# Patient Record
Sex: Female | Born: 1950 | Race: White | Hispanic: No | Smoking: Never smoker
Health system: Southern US, Community
[De-identification: ages and names within clinical notes are randomized; demographics above are authoritative.]

## PROBLEM LIST (undated history)

## (undated) DIAGNOSIS — K5792 Diverticulitis of intestine, part unspecified, without perforation or abscess without bleeding: Secondary | ICD-10-CM

## (undated) DIAGNOSIS — C439 Malignant melanoma of skin, unspecified: Secondary | ICD-10-CM

## (undated) HISTORY — PX: BREAST CYST ASPIRATION: SHX578

## (undated) HISTORY — DX: Malignant melanoma of skin, unspecified: C43.9

## (undated) HISTORY — PX: TONSILLECTOMY AND ADENOIDECTOMY: SUR1326

---

## 1978-10-22 HISTORY — PX: OVARIAN CYST REMOVAL: SHX89

## 1998-08-11 ENCOUNTER — Emergency Department (HOSPITAL_COMMUNITY): Admission: EM | Admit: 1998-08-11 | Discharge: 1998-08-11 | Payer: Self-pay

## 1998-10-24 ENCOUNTER — Other Ambulatory Visit: Admission: RE | Admit: 1998-10-24 | Discharge: 1998-10-24 | Payer: Self-pay | Admitting: Gynecology

## 1998-11-11 ENCOUNTER — Encounter: Payer: Self-pay | Admitting: Gynecology

## 1998-11-11 ENCOUNTER — Ambulatory Visit (HOSPITAL_COMMUNITY): Admission: RE | Admit: 1998-11-11 | Discharge: 1998-11-11 | Payer: Self-pay | Admitting: Gynecology

## 1998-11-18 ENCOUNTER — Ambulatory Visit (HOSPITAL_COMMUNITY): Admission: RE | Admit: 1998-11-18 | Discharge: 1998-11-18 | Payer: Self-pay | Admitting: Gynecology

## 1998-11-18 ENCOUNTER — Encounter: Payer: Self-pay | Admitting: Gynecology

## 2000-03-28 ENCOUNTER — Other Ambulatory Visit: Admission: RE | Admit: 2000-03-28 | Discharge: 2000-03-28 | Payer: Self-pay | Admitting: Obstetrics and Gynecology

## 2001-05-02 ENCOUNTER — Other Ambulatory Visit: Admission: RE | Admit: 2001-05-02 | Discharge: 2001-05-02 | Payer: Self-pay | Admitting: Obstetrics and Gynecology

## 2001-11-17 ENCOUNTER — Encounter: Payer: Self-pay | Admitting: Obstetrics and Gynecology

## 2001-11-17 ENCOUNTER — Ambulatory Visit (HOSPITAL_COMMUNITY): Admission: RE | Admit: 2001-11-17 | Discharge: 2001-11-17 | Payer: Self-pay | Admitting: Gynecology

## 2001-11-24 ENCOUNTER — Encounter: Payer: Self-pay | Admitting: Emergency Medicine

## 2001-11-24 ENCOUNTER — Emergency Department (HOSPITAL_COMMUNITY): Admission: EM | Admit: 2001-11-24 | Discharge: 2001-11-24 | Payer: Self-pay | Admitting: Emergency Medicine

## 2001-12-11 ENCOUNTER — Encounter: Payer: Self-pay | Admitting: Internal Medicine

## 2001-12-11 ENCOUNTER — Ambulatory Visit (HOSPITAL_COMMUNITY): Admission: RE | Admit: 2001-12-11 | Discharge: 2001-12-11 | Payer: Self-pay | Admitting: Internal Medicine

## 2002-07-31 ENCOUNTER — Encounter: Admission: RE | Admit: 2002-07-31 | Discharge: 2002-07-31 | Payer: Self-pay | Admitting: Internal Medicine

## 2002-07-31 ENCOUNTER — Encounter: Payer: Self-pay | Admitting: Internal Medicine

## 2002-11-11 ENCOUNTER — Ambulatory Visit (HOSPITAL_COMMUNITY): Admission: RE | Admit: 2002-11-11 | Discharge: 2002-11-11 | Payer: Self-pay | Admitting: Gastroenterology

## 2003-10-21 ENCOUNTER — Ambulatory Visit (HOSPITAL_COMMUNITY): Admission: RE | Admit: 2003-10-21 | Discharge: 2003-10-21 | Payer: Self-pay | Admitting: Internal Medicine

## 2004-10-19 ENCOUNTER — Ambulatory Visit (HOSPITAL_COMMUNITY): Admission: RE | Admit: 2004-10-19 | Discharge: 2004-10-19 | Payer: Self-pay | Admitting: Internal Medicine

## 2005-03-08 ENCOUNTER — Encounter: Admission: RE | Admit: 2005-03-08 | Discharge: 2005-03-08 | Payer: Self-pay | Admitting: Internal Medicine

## 2005-10-26 ENCOUNTER — Encounter: Admission: RE | Admit: 2005-10-26 | Discharge: 2005-10-26 | Payer: Self-pay | Admitting: Internal Medicine

## 2005-11-13 ENCOUNTER — Encounter: Admission: RE | Admit: 2005-11-13 | Discharge: 2005-11-13 | Payer: Self-pay | Admitting: Internal Medicine

## 2006-06-11 ENCOUNTER — Encounter: Admission: RE | Admit: 2006-06-11 | Discharge: 2006-06-11 | Payer: Self-pay | Admitting: Internal Medicine

## 2007-01-14 ENCOUNTER — Other Ambulatory Visit: Admission: RE | Admit: 2007-01-14 | Discharge: 2007-01-14 | Payer: Self-pay | Admitting: Gynecology

## 2007-01-14 ENCOUNTER — Encounter: Admission: RE | Admit: 2007-01-14 | Discharge: 2007-01-14 | Payer: Self-pay | Admitting: Internal Medicine

## 2007-02-24 ENCOUNTER — Encounter: Admission: RE | Admit: 2007-02-24 | Discharge: 2007-02-24 | Payer: Self-pay | Admitting: Internal Medicine

## 2008-02-04 ENCOUNTER — Ambulatory Visit (HOSPITAL_COMMUNITY): Admission: RE | Admit: 2008-02-04 | Discharge: 2008-02-04 | Payer: Self-pay | Admitting: Gynecology

## 2008-02-16 ENCOUNTER — Encounter: Admission: RE | Admit: 2008-02-16 | Discharge: 2008-02-16 | Payer: Self-pay | Admitting: Gynecology

## 2008-02-24 ENCOUNTER — Encounter: Admission: RE | Admit: 2008-02-24 | Discharge: 2008-02-24 | Payer: Self-pay | Admitting: Gynecology

## 2008-02-24 ENCOUNTER — Other Ambulatory Visit: Admission: RE | Admit: 2008-02-24 | Discharge: 2008-02-24 | Payer: Self-pay | Admitting: Diagnostic Radiology

## 2008-02-25 ENCOUNTER — Encounter (INDEPENDENT_AMBULATORY_CARE_PROVIDER_SITE_OTHER): Payer: Self-pay | Admitting: Diagnostic Radiology

## 2009-01-20 ENCOUNTER — Encounter (INDEPENDENT_AMBULATORY_CARE_PROVIDER_SITE_OTHER): Payer: Self-pay | Admitting: *Deleted

## 2009-03-02 ENCOUNTER — Ambulatory Visit: Payer: Self-pay | Admitting: Family Medicine

## 2009-03-02 DIAGNOSIS — R5383 Other fatigue: Secondary | ICD-10-CM

## 2009-03-02 DIAGNOSIS — Z8719 Personal history of other diseases of the digestive system: Secondary | ICD-10-CM

## 2009-03-02 DIAGNOSIS — R5381 Other malaise: Secondary | ICD-10-CM

## 2009-03-02 DIAGNOSIS — R1032 Left lower quadrant pain: Secondary | ICD-10-CM

## 2009-03-04 ENCOUNTER — Encounter (INDEPENDENT_AMBULATORY_CARE_PROVIDER_SITE_OTHER): Payer: Self-pay | Admitting: *Deleted

## 2009-03-04 LAB — CONVERTED CEMR LAB
Basophils Absolute: 0.1 10*3/uL (ref 0.0–0.1)
Basophils Relative: 0.8 % (ref 0.0–3.0)
Eosinophils Absolute: 0.2 10*3/uL (ref 0.0–0.7)
Eosinophils Relative: 2.2 % (ref 0.0–5.0)
HCT: 38.2 % (ref 36.0–46.0)
Hemoglobin: 13.3 g/dL (ref 12.0–15.0)
Lymphocytes Relative: 41.9 % (ref 12.0–46.0)
Lymphs Abs: 2.9 10*3/uL (ref 0.7–4.0)
MCHC: 34.7 g/dL (ref 30.0–36.0)
MCV: 91.5 fL (ref 78.0–100.0)
Monocytes Absolute: 0.7 10*3/uL (ref 0.1–1.0)
Monocytes Relative: 9.3 % (ref 3.0–12.0)
Neutro Abs: 3.1 10*3/uL (ref 1.4–7.7)
Neutrophils Relative %: 45.8 % (ref 43.0–77.0)
Platelets: 398 10*3/uL (ref 150.0–400.0)
RBC: 4.18 M/uL (ref 3.87–5.11)
RDW: 12.1 % (ref 11.5–14.6)
TSH: 1.07 microintl units/mL (ref 0.35–5.50)
WBC: 7 10*3/uL (ref 4.5–10.5)

## 2009-04-29 ENCOUNTER — Ambulatory Visit (HOSPITAL_COMMUNITY): Admission: RE | Admit: 2009-04-29 | Discharge: 2009-04-29 | Payer: Self-pay | Admitting: Family Medicine

## 2010-01-02 ENCOUNTER — Emergency Department (HOSPITAL_COMMUNITY): Admission: EM | Admit: 2010-01-02 | Discharge: 2010-01-02 | Payer: Self-pay | Admitting: Emergency Medicine

## 2010-06-06 ENCOUNTER — Ambulatory Visit (HOSPITAL_COMMUNITY): Admission: RE | Admit: 2010-06-06 | Discharge: 2010-06-06 | Payer: Self-pay | Admitting: Family Medicine

## 2010-07-25 ENCOUNTER — Ambulatory Visit: Payer: Self-pay | Admitting: Family Medicine

## 2010-11-23 NOTE — Assessment & Plan Note (Signed)
Summary: dizzy,ear pressure,congested/cbsi   Vital Signs:  Patient profile:   60 year old female Weight:      167.4 pounds BMI:     28.39 Temp:     98.4 degrees F oral BP sitting:   118 / 76 Cuff size:   regular  Vitals Entered By: Almeta Monas CMA Duncan Dull) (July 25, 2010 11:09 AM) CC: C/O DIZZINESS, NAUSEA, HEAD COLD AND FEELING TIRED, URI symptoms   History of Present Illness:       This is a 60 year old woman who presents with URI symptoms.  The symptoms began 1 week ago.  Pt c/o pain with inspiration last week.  Pt states it started with UR congestion. Pt only took otc ib and asa.  The patient complains of nasal congestion, purulent nasal discharge, dry cough, earache, and sick contacts.  The patient denies fever, low-grade fever (<100.5 degrees), fever of 100.5-103 degrees, fever of 103.1-104 degrees, fever to >104 degrees, stiff neck, dyspnea, wheezing, rash, vomiting, diarrhea, use of an antipyretic, and response to antipyretic.  The patient also reports headache and severe fatigue.  The patient denies itchy watery eyes, itchy throat, sneezing, seasonal symptoms, response to antihistamine, and muscle aches.  The patient denies the following risk factors for Strep sinusitis: unilateral facial pain, unilateral nasal discharge, poor response to decongestant, double sickening, tooth pain, Strep exposure, tender adenopathy, and absence of cough.    Current Medications (verified): 1)  Zithromax Z-Pak 250 Mg Tabs (Azithromycin) .... As Directed 2)  Claritin 10 Mg Tabs (Loratadine) .Marland Kitchen.. 1 By Mouth Once Daily 3)  Mucinex Dm 30-600 Mg Xr12h-Tab (Dextromethorphan-Guaifenesin)  Allergies (verified): No Known Drug Allergies  Past History:  Past medical, surgical, family and social histories (including risk factors) reviewed for relevance to current acute and chronic problems.  Past Medical History: Reviewed history from 03/02/2009 and no changes required. Diverticulitis, hx of  Past  Surgical History: Reviewed history from 03/02/2009 and no changes required. right ovarian cyst  wisdom teeth   Family History: Reviewed history from 03/02/2009 and no changes required. CAD-maternal brother HTN-no DM-no STROKE-no COLON CA-no BREAST CA- 2 sisters' dx in 39's  Social History: Reviewed history from 03/02/2009 and no changes required. 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 Nasal mucosa are pink and moist without lesions or exudates. Mouth:  Oral mucosa and oropharynx without lesions or exudates.  Teeth in good repair. Neck:  No deformities, masses, or tenderness noted. Lungs:  normal respiratory effort, no intercostal retractions, R wheezes, and L wheezes.   Heart:  Normal rate and regular rhythm. S1 and S2 normal without gallop, murmur, click, rub or other extra sounds. Psych:  Cognition and judgment appear intact. Alert and cooperative with normal attention span and concentration. No apparent delusions, illusions, hallucinations   Impression & Recommendations:  Problem # 1:  BRONCHITIS- ACUTE (ICD-466.0)  The following medications were removed from the medication list:    Metronidazole 500 Mg Tabs (Metronidazole) .Marland Kitchen... 1 two times a day x10 days Her updated medication list for this problem includes:    Zithromax Z-pak 250 Mg Tabs (Azithromycin) .Marland Kitchen... As directed    Mucinex Dm 30-600 Mg Xr12h-tab (Dextromethorphan-guaifenesin)  Take antibiotics and other medications as directed. Encouraged to push clear liquids,  get enough rest, and take acetaminophen as needed. To be seen in 5-7 days if no improvement, sooner if worse.  Complete Medication List: 1)  Zithromax Z-pak 250 Mg Tabs  (Azithromycin) .... As directed 2)  Claritin 10 Mg Tabs (Loratadine) .Marland Kitchen.. 1 by mouth once daily 3)  Mucinex Dm 30-600 Mg Xr12h-tab (Dextromethorphan-guaifenesin)  Patient Instructions: 1)  Acute Bronchitis symptoms for less then 10 days are not  helped by antibiotics. Take over the counter cough medications. Call if no improvement in 5-7 days, sooner if increasing cough, fever, or new symptoms ( shortness of breath, chest pain) .  Prescriptions: CLARITIN 10 MG TABS (LORATADINE) 1 by mouth once daily  #30 x 2   Entered and Authorized by:   Loreen Freud DO   Signed by:   Loreen Freud DO on 07/25/2010   Method used:   Faxed to ...       [facility] [address] (retail)       [address]       [place], [place]  [address]       Ph: [phone]       Fax: [phone]   RxID:   [id number] ZITHROMAX Z-PAK 250 MG TABS (AZITHROMYCIN) as directed  #1 x 0   Entered and Authorized by:   Loreen Freud DO   Signed by:   Loreen Freud DO on 07/25/2010   Method used:   Faxed to ...       [facility] [address] (retail)       [address]       [place], [place]  [address]       Ph: [phone]       Fax: [phone]   RxID:   [id number]

## 2011-03-09 NOTE — Op Note (Signed)
   Joy Mosley, Joy Mosley                           ACCOUNT NO.:  [id number]   MEDICAL RECORD NO.:  [id number]                   PATIENT TYPE:   LOCATION:                                       FACILITY:   PHYSICIAN:  Anselmo Rod, M.D.               DATE OF BIRTH:  07-07-1951   DATE OF PROCEDURE:  11/11/2002  DATE OF DISCHARGE:                                 OPERATIVE REPORT   PROCEDURE PERFORMED:  Colonoscopy.   ENDOSCOPIST:  Charna Elizabeth, M.D.   INSTRUMENT USED:  Pediatric adjustable Olympus colonoscope.   INDICATIONS FOR PROCEDURE:  The patient is a 60 year old white female  undergoing screening colonoscopy.  The patient had a history of occasional  rectal bleeding.  Rule out colonic polyps, masses, etc.   PREPROCEDURE PREPARATION:  Informed consent was procured from the patient.  The patient was fasted for eight hours prior to the procedure and prepped  with a bottle of Gatorade and MiraLax the night prior to the procedure.   PREPROCEDURE PHYSICAL:  The patient had stable vital signs.  Neck supple.  Chest clear to auscultation.  S1 and S2 regular.  Abdomen soft with normal  bowel sounds.   DESCRIPTION OF PROCEDURE:  The patient was placed in left lateral decubitus  position and sedated with 70 mg of Demerol and 6 mg of Versed intravenously.  Once the patient was adequately sedated and maintained on low flow oxygen  and continuous cardiac monitoring, the Olympus video colonoscope was  advanced from the rectum to the cecum without difficulty.  Small nonbleeding  hemorrhoids were seen on retroflexion.  No masses, polyps, erosions,  ulcerations or diverticula were present.   IMPRESSION:  Normal colonoscopy up to the cecum except for small internal  hemorrhoids and a few left sided diverticuli. Marland Kitchen    RECOMMENDATIONS:  High fiber diet with liveral fluid intake.  Repeat  colorectal cancer screening in five years unless she develops any abnormal  symptoms in the interim.  OP  follow-up on a prn basis.   RECOMMENDATIONS:  1. A high fiber diet has been recommended for the patient with liberal fluid     intake.  2. Repeat colorectal cancer screening is advised in the next five years     unless the patient develops any abnormal symptoms in the interim.                                                   Anselmo Rod, M.D.    JNM/MEDQ  D:  11/11/2002  T:  11/11/2002  Job:  [id number]   cc:   Olene Craven, M.D.  [address]  [address]  [place]  [place] [address]  Fax: [phone]

## 2011-03-09 NOTE — Op Note (Signed)
   NAME:  Joy Mosley, Joy Mosley                         ACCOUNT NO.:  [id number]   MEDICAL RECORD NO.:  [id number]                   PATIENT TYPE:  AMB   LOCATION:  ENDO                                 FACILITY:  [facility]   PHYSICIAN:  Anselmo Rod, M.D.               DATE OF BIRTH:  11-Nov-1950   DATE OF PROCEDURE:  11/11/2002  DATE OF DISCHARGE:                                 OPERATIVE REPORT   ADDENDUM:  The patient had a couple of left-sided diverticula with no other  abnormalities noted on colonoscopy except for small internal hemorrhoids.                                               Anselmo Rod, M.D.    JNM/MEDQ  D:  11/11/2002  T:  11/11/2002  Job:  [id number]   cc:   Olene Craven, M.D.  [address]  [address]  [place]  [place] [address]  Fax: [phone]

## 2011-09-06 ENCOUNTER — Other Ambulatory Visit: Payer: Self-pay | Admitting: Family Medicine

## 2011-09-06 DIAGNOSIS — Z1231 Encounter for screening mammogram for malignant neoplasm of breast: Secondary | ICD-10-CM

## 2011-10-09 ENCOUNTER — Ambulatory Visit (HOSPITAL_COMMUNITY)
Admission: RE | Admit: 2011-10-09 | Discharge: 2011-10-09 | Disposition: A | Discharge status: Home / Self Care | Payer: 59 | Source: Ambulatory Visit | Attending: Family Medicine | Admitting: Family Medicine

## 2011-10-09 DIAGNOSIS — Z1231 Encounter for screening mammogram for malignant neoplasm of breast: Secondary | ICD-10-CM | POA: Insufficient documentation

## 2012-06-09 ENCOUNTER — Encounter: Payer: Self-pay | Admitting: *Deleted

## 2012-06-09 ENCOUNTER — Ambulatory Visit (INDEPENDENT_AMBULATORY_CARE_PROVIDER_SITE_OTHER): Payer: 59 | Admitting: Family Medicine

## 2012-06-09 VITALS — BP 110/68 | HR 60 | Temp 97.6°F | Wt 169.0 lb

## 2012-06-09 DIAGNOSIS — R5383 Other fatigue: Secondary | ICD-10-CM

## 2012-06-09 DIAGNOSIS — R5381 Other malaise: Secondary | ICD-10-CM

## 2012-06-09 LAB — CBC WITH DIFFERENTIAL/PLATELET
Basophils Absolute: 0.1 10*3/uL (ref 0.0–0.1)
Basophils Absolute: 0.1 10*3/uL (ref 0.0–0.1)
Basophils Relative: 0.8 % (ref 0.0–3.0)
Basophils Relative: 0.6 % (ref 0.0–3.0)
Eosinophils Absolute: 0.1 10*3/uL (ref 0.0–0.7)
Eosinophils Absolute: 0.1 10*3/uL (ref 0.0–0.7)
Eosinophils Relative: 2.1 % (ref 0.0–5.0)
Eosinophils Relative: 1.4 % (ref 0.0–5.0)
HCT: 41.1 % (ref 36.0–46.0)
HCT: 40.8 % (ref 36.0–46.0)
Hemoglobin: 13.7 g/dL (ref 12.0–15.0)
Hemoglobin: 13.5 g/dL (ref 12.0–15.0)
Lymphocytes Relative: 42.9 % (ref 12.0–46.0)
Lymphocytes Relative: 41.2 % (ref 12.0–46.0)
Lymphs Abs: 3 10*3/uL (ref 0.7–4.0)
Lymphs Abs: 3.4 10*3/uL (ref 0.7–4.0)
MCHC: 33.4 g/dL (ref 30.0–36.0)
MCHC: 33.1 g/dL (ref 30.0–36.0)
MCV: 91.7 fl (ref 78.0–100.0)
MCV: 91.3 fl (ref 78.0–100.0)
Monocytes Absolute: 0.6 10*3/uL (ref 0.1–1.0)
Monocytes Absolute: 0.7 10*3/uL (ref 0.1–1.0)
Monocytes Relative: 8.4 % (ref 3.0–12.0)
Monocytes Relative: 9 % (ref 3.0–12.0)
Neutro Abs: 3.2 10*3/uL (ref 1.4–7.7)
Neutro Abs: 4 10*3/uL (ref 1.4–7.7)
Neutrophils Relative %: 45.8 % (ref 43.0–77.0)
Neutrophils Relative %: 47.8 % (ref 43.0–77.0)
Platelets: 299 10*3/uL (ref 150.0–400.0)
Platelets: 296 10*3/uL (ref 150.0–400.0)
RBC: 4.48 Mil/uL (ref 3.87–5.11)
RBC: 4.47 Mil/uL (ref 3.87–5.11)
RDW: 13.4 % (ref 11.5–14.6)
RDW: 13.4 % (ref 11.5–14.6)
WBC: 7 10*3/uL (ref 4.5–10.5)
WBC: 8.3 10*3/uL (ref 4.5–10.5)

## 2012-06-09 LAB — BASIC METABOLIC PANEL
BUN: 15 mg/dL (ref 6–23)
CO2: 30 mEq/L (ref 19–32)
Calcium: 8.9 mg/dL (ref 8.4–10.5)
Chloride: 106 mEq/L (ref 96–112)
Creatinine, Ser: 0.7 mg/dL (ref 0.4–1.2)
GFR: 96.21 mL/min (ref >60.00)
Glucose, Bld: 69 mg/dL — ABNORMAL LOW (ref 70–99)
Potassium: 4 mEq/L (ref 3.5–5.1)
Sodium: 141 mEq/L (ref 135–145)

## 2012-06-09 LAB — TSH: TSH: 1.08 u[IU]/mL (ref 0.35–5.50)

## 2012-06-09 NOTE — Progress Notes (Signed)
  Subjective:    Patient ID: Joy Mosley, female    DOB: 16-Oct-1951, 61 y.o.   MRN: [id number]  HPI Fatigue- reports feeling 'really tired and run down'.  2 weeks ago felt 'flu-ish', 'achy and tired'.  Denies fever, chills.  Had some nausea, HA but this has all improved.  No known sick contacts.  Sleeping well.  Denies increased or excessive stress.  Biggest complaint w/ 'so tired'.   Review of Systems For ROS see HPI     Objective:   Physical Exam  Constitutional: She is oriented to person, place, and time. She appears well-developed and well-nourished. No distress.  HENT:  Head: Normocephalic and atraumatic.       TMs normal bilaterally Mild nasal congestion Throat w/out erythema, edema, or exudate  Eyes: Conjunctivae and EOM are normal. Pupils are equal, round, and reactive to light.  Neck: Normal range of motion. Neck supple. No thyromegaly present.  Cardiovascular: Normal rate, regular rhythm, normal heart sounds and intact distal pulses.   No murmur heard. Pulmonary/Chest: Effort normal and breath sounds normal. No respiratory distress. She has no wheezes.  Musculoskeletal: She exhibits no edema.  Lymphadenopathy:    She has no cervical adenopathy.  Neurological: She is alert and oriented to person, place, and time. No cranial nerve deficit. Coordination normal.  Skin: Skin is warm and dry.  Psychiatric: She has a normal mood and affect. Her behavior is normal. Thought content normal.          Assessment & Plan:

## 2012-06-09 NOTE — Patient Instructions (Addendum)
Schedule your complete physical w/ pap at your convenience We'll notify you of your lab results Make sure you are getting plenty of rest and drinking lots of fluids! Call with any questions or concerns Hang in there!!!

## 2012-06-10 NOTE — Assessment & Plan Note (Signed)
Pt w/ similar sxs 3 yrs ago but suddenly recurred.  May be viral illness as pt had flu like sxs that have resolved.  Check labs to r/o anemia, thyroid problem, metabolic abnormality.  Pt expressed understanding and is in agreement w/ plan.

## 2012-08-12 ENCOUNTER — Encounter: Payer: Self-pay | Admitting: Family Medicine

## 2012-08-12 ENCOUNTER — Ambulatory Visit (INDEPENDENT_AMBULATORY_CARE_PROVIDER_SITE_OTHER): Payer: 59 | Admitting: Family Medicine

## 2012-08-12 VITALS — BP 105/74 | HR 79 | Temp 98.5°F | Ht 65.0 in | Wt 165.8 lb

## 2012-08-12 DIAGNOSIS — K5901 Slow transit constipation: Secondary | ICD-10-CM | POA: Insufficient documentation

## 2012-08-12 NOTE — Patient Instructions (Addendum)
Start Miralax 1-2x/day until you start having regular BMs and then decrease dose until you are having 1 easy BM daily Increase your fluid and fiber intake Try and get regular activity- this will also help w/ regularity Call with any questions or concerns- particularly if no relief Hang in there!

## 2012-08-12 NOTE — Progress Notes (Signed)
  Subjective:    Patient ID: Joy Mosley, female    DOB: 1951/05/12, 61 y.o.   MRN: [id number]  HPI Constipation- 4 weeks ago had diverticulitis flare that took 1 week to improve.  Subsequently developed constipation.  Was having small volume stools, lots of gas.  10 days ago had diarrhea after taking OTC laxative but again everything stopped.  Now having urge to defecate but no results.  + straining.  No nausea.  Inadequate fluid intake.   Review of Systems For ROS see HPI     Objective:   Physical Exam  Vitals reviewed. Constitutional: She appears well-developed and well-nourished. No distress.  Cardiovascular: Normal rate, regular rhythm and normal heart sounds.   Pulmonary/Chest: Effort normal and breath sounds normal. No respiratory distress. She has no wheezes. She has no rales.  Abdominal: Soft. Bowel sounds are normal. She exhibits no distension. There is tenderness (mild TTP across bilateral lower quadrants). There is no rebound and no guarding.          Assessment & Plan:

## 2012-08-12 NOTE — Assessment & Plan Note (Signed)
New.  Likely due to decreased oral intake, low fiber diet, and lack of exercise.  Encouraged dietary and lifestyle modifications and starting Miralax.  Reviewed supportive care and red flags that should prompt return.  Pt expressed understanding and is in agreement w/ plan.

## 2013-01-16 ENCOUNTER — Other Ambulatory Visit: Payer: Self-pay | Admitting: Family Medicine

## 2013-01-16 DIAGNOSIS — Z1231 Encounter for screening mammogram for malignant neoplasm of breast: Secondary | ICD-10-CM

## 2013-02-04 ENCOUNTER — Ambulatory Visit (HOSPITAL_COMMUNITY): Payer: 59

## 2013-02-04 ENCOUNTER — Ambulatory Visit (HOSPITAL_COMMUNITY)
Admission: RE | Admit: 2013-02-04 | Discharge: 2013-02-04 | Disposition: A | Discharge status: Home / Self Care | Payer: 59 | Source: Ambulatory Visit | Attending: Family Medicine | Admitting: Family Medicine

## 2013-02-04 DIAGNOSIS — Z1231 Encounter for screening mammogram for malignant neoplasm of breast: Secondary | ICD-10-CM | POA: Insufficient documentation

## 2014-08-05 ENCOUNTER — Encounter: Payer: Self-pay | Admitting: General Practice

## 2014-08-05 ENCOUNTER — Ambulatory Visit (INDEPENDENT_AMBULATORY_CARE_PROVIDER_SITE_OTHER): Payer: 59 | Admitting: Family Medicine

## 2014-08-05 ENCOUNTER — Encounter: Payer: Self-pay | Admitting: Family Medicine

## 2014-08-05 VITALS — BP 130/84 | HR 74 | Temp 98.0°F | Resp 16 | Wt 174.0 lb

## 2014-08-05 DIAGNOSIS — R5383 Other fatigue: Secondary | ICD-10-CM

## 2014-08-05 LAB — BASIC METABOLIC PANEL
BUN: 15 mg/dL (ref 6–23)
CO2: 29 mEq/L (ref 19–32)
Calcium: 9 mg/dL (ref 8.4–10.5)
Chloride: 106 mEq/L (ref 96–112)
Creatinine, Ser: 0.7 mg/dL (ref 0.4–1.2)
GFR: 86.42 mL/min (ref >60.00)
Glucose, Bld: 91 mg/dL (ref 70–99)
Potassium: 4.6 mEq/L (ref 3.5–5.1)
Sodium: 138 mEq/L (ref 135–145)

## 2014-08-05 LAB — HEPATIC FUNCTION PANEL
ALK PHOS: 51 U/L (ref 39–117)
ALT: 17 U/L (ref 0–35)
AST: 20 U/L (ref 0–37)
Albumin: 3.5 g/dL (ref 3.5–5.2)
BILIRUBIN DIRECT: 0.1 mg/dL (ref 0.0–0.3)
BILIRUBIN TOTAL: 0.6 mg/dL (ref 0.2–1.2)
Total Protein: 7.3 g/dL (ref 6.0–8.3)

## 2014-08-05 LAB — CBC WITH DIFFERENTIAL/PLATELET
Basophils Absolute: 0.1 10*3/uL (ref 0.0–0.1)
Basophils Relative: 1 % (ref 0.0–3.0)
Eosinophils Absolute: 0.1 10*3/uL (ref 0.0–0.7)
Eosinophils Relative: 1.7 % (ref 0.0–5.0)
HCT: 41.2 % (ref 36.0–46.0)
Hemoglobin: 13.8 g/dL (ref 12.0–15.0)
Lymphocytes Relative: 44.5 % (ref 12.0–46.0)
Lymphs Abs: 2.9 10*3/uL (ref 0.7–4.0)
MCHC: 33.5 g/dL (ref 30.0–36.0)
MCV: 90.2 fl (ref 78.0–100.0)
Monocytes Absolute: 0.5 10*3/uL (ref 0.1–1.0)
Monocytes Relative: 7.5 % (ref 3.0–12.0)
Neutro Abs: 2.9 10*3/uL (ref 1.4–7.7)
Neutrophils Relative %: 45.3 % (ref 43.0–77.0)
Platelets: 293 10*3/uL (ref 150.0–400.0)
RBC: 4.57 Mil/uL (ref 3.87–5.11)
RDW: 13.4 % (ref 11.5–15.5)
WBC: 6.4 10*3/uL (ref 4.0–10.5)

## 2014-08-05 LAB — TSH: TSH: 0.44 u[IU]/mL (ref 0.35–4.50)

## 2014-08-05 NOTE — Progress Notes (Signed)
   Subjective:    Patient ID: Kathi DerBonnie S Latimore, female    DOB: 10/28/1950, 63 y.o.   MRN: [id number]  Headache    Fatigue- sxs started ~1 week ago.  'very run down'.  Mild dizziness.  Yesterday afternoon sxs started to improve.  No coughing/sneezing.  No fevers.  No nasal congestion.  No abd pain, N/V.  Sleeping well.  Denies increased stressors.    Review of Systems  Neurological: Positive for headaches.   For ROS see HPI     Objective:   Physical Exam  Vitals reviewed. Constitutional: She is oriented to person, place, and time. She appears well-developed and well-nourished. No distress.  HENT:  Head: Normocephalic and atraumatic.  Eyes: Conjunctivae and EOM are normal. Pupils are equal, round, and reactive to light.  Neck: Normal range of motion. Neck supple. No thyromegaly present.  Cardiovascular: Normal rate, regular rhythm, normal heart sounds and intact distal pulses.   No murmur heard. Pulmonary/Chest: Effort normal and breath sounds normal. No respiratory distress.  Abdominal: Soft. She exhibits no distension. There is no tenderness.  Musculoskeletal: She exhibits no edema.  Lymphadenopathy:    She has no cervical adenopathy.  Neurological: She is alert and oriented to person, place, and time.  Skin: Skin is warm and dry.  Psychiatric: She has a normal mood and affect. Her behavior is normal.          Assessment & Plan:

## 2014-08-05 NOTE — Patient Instructions (Signed)
Schedule your complete physical in 6 months We'll notify you of your lab results and make any changes if needed Rest when you need it! Call with any questions or concerns Hang in there!!

## 2014-08-05 NOTE — Progress Notes (Signed)
Pre visit review using our clinic review tool, if applicable. No additional management support is needed unless otherwise documented below in the visit note. 

## 2014-08-05 NOTE — Assessment & Plan Note (Signed)
New.  No obvious cause on physical exam.  No evidence of bacterial illness.  Suspect pt had virus that is spontaneously resolving.  Check labs to r/o anemia, electrolyte abnormality, or thyroid abnormality.  Reviewed supportive care and red flags that should prompt return.  Pt expressed understanding and is in agreement w/ plan.

## 2014-08-08 ENCOUNTER — Emergency Department (HOSPITAL_BASED_OUTPATIENT_CLINIC_OR_DEPARTMENT_OTHER): Payer: 59

## 2014-08-08 ENCOUNTER — Encounter (HOSPITAL_BASED_OUTPATIENT_CLINIC_OR_DEPARTMENT_OTHER): Payer: Self-pay | Admitting: Emergency Medicine

## 2014-08-08 ENCOUNTER — Emergency Department (HOSPITAL_BASED_OUTPATIENT_CLINIC_OR_DEPARTMENT_OTHER)
Admission: EM | Admit: 2014-08-08 | Discharge: 2014-08-08 | Disposition: A | Discharge status: Home / Self Care | Payer: 59 | Attending: Emergency Medicine | Admitting: Emergency Medicine

## 2014-08-08 DIAGNOSIS — R079 Chest pain, unspecified: Secondary | ICD-10-CM

## 2014-08-08 LAB — CBC WITH DIFFERENTIAL/PLATELET
BASOS ABS: 0 10*3/uL (ref 0.0–0.1)
Basophils Relative: 0 % (ref 0–1)
EOS ABS: 0.1 10*3/uL (ref 0.0–0.7)
Eosinophils Relative: 1 % (ref 0–5)
HCT: 40.3 % (ref 36.0–46.0)
HEMOGLOBIN: 13.7 g/dL (ref 12.0–15.0)
Lymphocytes Relative: 42 % (ref 12–46)
Lymphs Abs: 3.3 10*3/uL (ref 0.7–4.0)
MCH: 30.6 pg (ref 26.0–34.0)
MCHC: 34 g/dL (ref 30.0–36.0)
MCV: 90.2 fL (ref 78.0–100.0)
MONOS PCT: 10 % (ref 3–12)
Monocytes Absolute: 0.7 10*3/uL (ref 0.1–1.0)
NEUTROS ABS: 3.6 10*3/uL (ref 1.7–7.7)
Neutrophils Relative %: 47 % (ref 43–77)
PLATELETS: 291 10*3/uL (ref 150–400)
RBC: 4.47 MIL/uL (ref 3.87–5.11)
RDW: 13.1 % (ref 11.5–15.5)
WBC: 7.7 10*3/uL (ref 4.0–10.5)

## 2014-08-08 LAB — D-DIMER, QUANTITATIVE: D-Dimer, Quant: <0.27 ug/mL-FEU (ref 0.00–0.48)

## 2014-08-08 LAB — TROPONIN I
Troponin I: <0.3 ng/mL (ref <0.30)
Troponin I: <0.3 ng/mL (ref <0.30)

## 2014-08-08 LAB — BASIC METABOLIC PANEL
ANION GAP: 13 (ref 5–15)
BUN: 19 mg/dL (ref 6–23)
CALCIUM: 9.3 mg/dL (ref 8.4–10.5)
CO2: 26 mEq/L (ref 19–32)
CREATININE: 0.8 mg/dL (ref 0.50–1.10)
Chloride: 102 mEq/L (ref 96–112)
GFR, EST AFRICAN AMERICAN: 88 mL/min — AB (ref >90)
GFR, EST NON AFRICAN AMERICAN: 76 mL/min — AB (ref >90)
Glucose, Bld: 98 mg/dL (ref 70–99)
Potassium: 4.4 mEq/L (ref 3.7–5.3)
Sodium: 141 mEq/L (ref 137–147)

## 2014-08-08 MED ORDER — ACETAMINOPHEN 325 MG PO TABS
650.0000 mg | ORAL_TABLET | Freq: Once | ORAL | Status: AC
  Administered 2014-08-08: 650 mg via ORAL
  Filled 2014-08-08: qty 2

## 2014-08-08 MED ORDER — NITROGLYCERIN 0.4 MG SL SUBL
0.4000 mg | SUBLINGUAL_TABLET | SUBLINGUAL | Status: DC | PRN
  Administered 2014-08-08: 0.4 mg via SUBLINGUAL
  Filled 2014-08-08: qty 1

## 2014-08-08 NOTE — Discharge Instructions (Signed)

## 2014-08-08 NOTE — ED Notes (Signed)
Dr. Fonnie JarvisBednar at bedside -- pt reports she no longer has pain in L arm, but has some pain in chest with inspiration. Pt's recent SBP's in the 90s. Order received not to give Nitro at this point. Danna HeftyGolden, Ameliya Nicotra Lee, RN

## 2014-08-08 NOTE — ED Provider Notes (Signed)
CSN: [id number]     Arrival date & time 08/08/14  1003 History   First MD Initiated Contact with Patient 08/08/14 1005     Chief Complaint  Patient presents with  . Chest Pain     (Consider location/radiation/quality/duration/timing/severity/associated sxs/prior Treatment) HPI 63 year old healthy female sudden onset moderate chest pressure 45 minutes prior to arrival now very mild and almost gone; it did radiate toward left shoulder; no associated symptoms; took 325 mg aspirin prior to arrival for treatment; no history of similar symptoms; discomfort is not sharp or stabbing nonexertional nonpleuritic; she is no fever no cough no shortness of breath no sweats no nausea no back pain no abdominal pain no lightheadedness and no other concerns. She does have about a week and a half of some generalized fatigue with normal TSH testing normal CBC normal comprehensive metabolic panel within the last few days at her primary care Dr.  History reviewed. No pertinent past medical history. Past Surgical History  Procedure Laterality Date  . Ovarian cyst removal  1980  . Tonsillectomy and adenoidectomy     Family History  Problem Relation Age of Onset  . Leukemia Mother   . Cancer Father     prostate  . Cancer Maternal Grandfather    History  Substance Use Topics  . Smoking status: Never Smoker   . Smokeless tobacco: Not on file  . Alcohol Use: Yes     Comment: rarely   OB History    No data available     Review of Systems  10 Systems reviewed and are negative for acute change except as noted in the HPI.  Allergies  Review of patient's allergies indicates no known allergies.  Home Medications   Prior to Admission medications   Not on File   BP 101/62 mmHg  Pulse 84  Temp(Src) 98.6 F (37 C) (Oral)  Resp 14  SpO2 98% Physical Exam  Nursing note and vitals reviewed. Constitutional:  Awake, alert, nontoxic appearance.  HENT:  Head: Atraumatic.  Eyes: Right eye exhibits no  discharge. Left eye exhibits no discharge.  Neck: Neck supple.  Cardiovascular: Normal rate and regular rhythm.   No murmur heard. Pulmonary/Chest: Effort normal and breath sounds normal. No respiratory distress. She has no wheezes. She has no rales. She exhibits no tenderness.  Pulse oximetry normal on room air 93%  Abdominal: Soft. Bowel sounds are normal. She exhibits no distension. There is no tenderness. There is no rebound.  Musculoskeletal: She exhibits no edema and no tenderness.  Baseline ROM, no obvious new focal weakness.  Neurological: She is alert.  Mental status and motor strength appears baseline for patient and situation.  Skin: No rash noted.  Psychiatric: She has a normal mood and affect.    ED Course  Procedures (including critical care time) HEART score 1. Patient / Family / Caregiver understand and agree with initial ED impression and plan with expectations set for ED visit. Labs Review Labs Reviewed  BASIC METABOLIC PANEL - Abnormal; Notable for the following:    GFR calc non Af Amer 76 (*)    GFR calc Af Amer 88 (*)    All other components within normal limits  CBC WITH DIFFERENTIAL  TROPONIN I  TROPONIN I  TROPONIN I  D-DIMER, QUANTITATIVE    Imaging Review No results found.   EKG Interpretation   Date/Time:  Sunday August 08 2014 10:10:31 EDT Ventricular Rate:  67 PR Interval:  96 QRS Duration: 68 QT Interval:  380  QTC Calculation: 401 R Axis:   51 Text Interpretation:  Sinus rhythm with short PR Low voltage QRS No  significant change since last tracing Confirmed by [facility]  MD, Jonny RuizJOHN  [phone]) on 08/08/2014 10:21:31 AM      MDM   Final diagnoses:  Chest pain    Dispo pending.    Hurman HornJohn M Katherleen Folkes, MD 08/22/14 [phone]

## 2014-09-07 ENCOUNTER — Emergency Department (HOSPITAL_BASED_OUTPATIENT_CLINIC_OR_DEPARTMENT_OTHER)
Admission: EM | Admit: 2014-09-07 | Discharge: 2014-09-07 | Disposition: A | Discharge status: Home / Self Care | Payer: Medicare Other | Attending: Emergency Medicine | Admitting: Emergency Medicine

## 2014-09-07 ENCOUNTER — Emergency Department (HOSPITAL_BASED_OUTPATIENT_CLINIC_OR_DEPARTMENT_OTHER): Payer: Medicare Other

## 2014-09-07 ENCOUNTER — Encounter (HOSPITAL_BASED_OUTPATIENT_CLINIC_OR_DEPARTMENT_OTHER): Payer: Self-pay | Admitting: *Deleted

## 2014-09-07 DIAGNOSIS — S60221A Contusion of right hand, initial encounter: Secondary | ICD-10-CM | POA: Diagnosis not present

## 2014-09-07 DIAGNOSIS — Y92520 Airport as the place of occurrence of the external cause: Secondary | ICD-10-CM | POA: Insufficient documentation

## 2014-09-07 DIAGNOSIS — S199XXA Unspecified injury of neck, initial encounter: Secondary | ICD-10-CM | POA: Insufficient documentation

## 2014-09-07 DIAGNOSIS — M79641 Pain in right hand: Secondary | ICD-10-CM | POA: Diagnosis not present

## 2014-09-07 DIAGNOSIS — S6991XA Unspecified injury of right wrist, hand and finger(s), initial encounter: Secondary | ICD-10-CM | POA: Diagnosis not present

## 2014-09-07 DIAGNOSIS — S060X0A Concussion without loss of consciousness, initial encounter: Secondary | ICD-10-CM

## 2014-09-07 DIAGNOSIS — Y998 Other external cause status: Secondary | ICD-10-CM | POA: Insufficient documentation

## 2014-09-07 DIAGNOSIS — Y9389 Activity, other specified: Secondary | ICD-10-CM | POA: Diagnosis not present

## 2014-09-07 DIAGNOSIS — W19XXXA Unspecified fall, initial encounter: Secondary | ICD-10-CM

## 2014-09-07 DIAGNOSIS — S0083XA Contusion of other part of head, initial encounter: Secondary | ICD-10-CM | POA: Insufficient documentation

## 2014-09-07 DIAGNOSIS — S0990XA Unspecified injury of head, initial encounter: Secondary | ICD-10-CM | POA: Diagnosis not present

## 2014-09-07 DIAGNOSIS — R51 Headache: Secondary | ICD-10-CM | POA: Diagnosis not present

## 2014-09-07 DIAGNOSIS — W01198A Fall on same level from slipping, tripping and stumbling with subsequent striking against other object, initial encounter: Secondary | ICD-10-CM | POA: Diagnosis not present

## 2014-09-07 MED ORDER — TRAMADOL HCL 50 MG PO TABS: 50.0000 mg | ORAL_TABLET | Freq: Four times a day (QID) | ORAL | Status: DC | PRN

## 2014-09-07 NOTE — Discharge Instructions (Signed)
CT of the head showed no skull fracture or any brain injury. CT of the neck was negative for any bony injury. X-ray of the right hand showed no bony injury. Symptoms are consistent with a concussion seems to be mild. Will probably take about a week to recover. Take the tramadol as needed for the hand pain neck pain and head pain. Would recommend time off from work. 3 days off note provided. Return for any new or worse symptoms. If not improving at the end of the week follow-up with your regular doctor.     Concussion A concussion is a brain injury. It is caused by:  A hit to the head.  A quick and sudden movement (jolt) of the head or neck. A concussion is usually not life threatening. Even so, it can cause serious problems. If you had a concussion before, you may have concussion-like problems after a hit to your head. HOME CARE General Instructions  Follow your doctor's directions carefully.  Take medicines only as told by your doctor.  Only take medicines your doctor says are safe.  Do not drink alcohol until your doctor says it is okay. Alcohol and some drugs can slow down healing. They can also put you at risk for further injury.  If you are having trouble remembering things, write them down.  Try to do one thing at a time if you get distracted easily. For example, do not watch TV while making dinner.  Talk to your family members or close friends when making important decisions.  Follow up with your doctor as told.  Watch your symptoms. Tell others to do the same. Serious problems can sometimes happen after a concussion. Older adults are more likely to have these problems.  Tell your teachers, school nurse, school counselor, coach, Event organiserathletic trainer, or work Production designer, theatre/television/filmmanager about your concussion. Tell them about what you can or cannot do. They should watch to see if:  It gets even harder for you to pay attention or concentrate.  It gets even harder for you to remember things or learn  new things.  You need more time than normal to finish things.  You become annoyed (irritable) more than before.  You are not able to deal with stress as well.  You have more problems than before.  Rest. Make sure you:  Get plenty of sleep at night.  Go to sleep early.  Go to bed at the same time every day. Try to wake up at the same time.  Rest during the day.  Take naps when you feel tired.  Limit activities where you have to think a lot or concentrate. These include:  Doing homework.  Doing work related to a job.  Watching TV.  Using the computer. Returning To Your Regular Activities Return to your normal activities slowly, not all at once. You must give your body and brain enough time to heal.   Do not play sports or do other athletic activities until your doctor says it is okay.  Ask your doctor when you can drive, ride a bicycle, or work other vehicles or machines. Never do these things if you feel dizzy.  Ask your doctor about when you can return to work or school. Preventing Another Concussion It is very important to avoid another brain injury, especially before you have healed. In rare cases, another injury can lead to permanent brain damage, brain swelling, or death. The risk of this is greatest during the first 7-10 days after your injury.  Avoid injuries by:   Wearing a seat belt when riding in a car.  Not drinking too much alcohol.  Avoiding activities that could lead to a second concussion (such as contact sports).  Wearing a helmet when doing activities like:  Biking.  Skiing.  Skateboarding.  Skating.  Making your home safer by:  Removing things from the floor or stairways that could make you trip.  Using grab bars in bathrooms and handrails by stairs.  Placing non-slip mats on floors and in bathtubs.  Improve lighting in dark areas. GET HELP IF:  It gets even harder for you to pay attention or concentrate.  It gets even harder for  you to remember things or learn new things.  You need more time than normal to finish things.  You become annoyed (irritable) more than before.  You are not able to deal with stress as well.  You have more problems than before.  You have problems keeping your balance.  You are not able to react quickly when you should. Get help if you have any of these problems for more than 2 weeks:   Lasting (chronic) headaches.  Dizziness or trouble balancing.  Feeling sick to your stomach (nausea).  Seeing (vision) problems.  Being affected by noises or light more than normal.  Feeling sad, low, down in the dumps, blue, gloomy, or empty (depressed).  Mood changes (mood swings).  Feeling of fear or nervousness about what may happen (anxiety).  Feeling annoyed.  Memory problems.  Problems concentrating or paying attention.  Sleep problems.  Feeling tired all the time. GET HELP RIGHT AWAY IF:   You have bad headaches or your headaches get worse.  You have weakness (even if it is in one hand, leg, or part of the face).  You have loss of feeling (numbness).  You feel off balance.  You keep throwing up (vomiting).  You feel tired.  One black center of your eye (pupil) is larger than the other.  You twitch or shake violently (convulse).  Your speech is not clear (slurred).  You are more confused, easily angered (agitated), or annoyed than before.  You have more trouble resting than before.  You are unable to recognize people or places.  You have neck pain.  It is difficult to wake you up.  You have unusual behavior changes.  You pass out (lose consciousness). MAKE SURE YOU:   Understand these instructions.  Will watch your condition.  Will get help right away if you are not doing well or get worse. Document Released: 09/26/2009 Document Revised: 02/22/2014 Document Reviewed: 04/30/2013 [facility] Patient Information 2015 [employer] [place]. This information is  not intended to replace advice given to you by your health care provider. Make sure you discuss any questions you have with your health care provider.

## 2014-09-07 NOTE — ED Notes (Signed)
Reports she [place] and fell at airport Friday night- hit chest,right hand and chin- denies LOC but reports she hit with enough force to "knock the breath out of her" - today c/o headache

## 2014-09-07 NOTE — ED Provider Notes (Signed)
CSN: [id number]     Arrival date & time 09/07/14  0907 History   First MD Initiated Contact with Patient 09/07/14 0935     Chief Complaint  Patient presents with  . Fall     (Consider location/radiation/quality/duration/timing/severity/associated sxs/prior Treatment) Patient is a 63 y.o. female presenting with fall. The history is provided by the patient.  Fall Associated symptoms include headaches. Pertinent negatives include no chest pain, no abdominal pain and no shortness of breath.  patient with fall on Friday night at the airport. Landed on her right hand hit her chest anteriorly and her chin on the right side. No loss of consciousness. When was knocked out of her briefly. Patient has some bruising to her chin bruising to her right hand and swelling to her right hand. There is right hand pain that she says about 4 out of 10. Patient has gone on to develop neck pain bilaterally and also headache which is global. No nausea no vomiting no visual changes. No numbness or weakness. Headache is about 7 out of 10.  History reviewed. No pertinent past medical history. Past Surgical History  Procedure Laterality Date  . Ovarian cyst removal  1980  . Tonsillectomy and adenoidectomy     Family History  Problem Relation Age of Onset  . Leukemia Mother   . Cancer Father     prostate  . Cancer Maternal Grandfather    History  Substance Use Topics  . Smoking status: Never Smoker   . Smokeless tobacco: Never Used  . Alcohol Use: No     Comment: rarely   OB History    No data available     Review of Systems  Constitutional: Positive for fatigue. Negative for fever.  HENT: Negative for congestion.   Eyes: Negative for visual disturbance.  Respiratory: Negative for shortness of breath.   Cardiovascular: Negative for chest pain.  Gastrointestinal: Negative for nausea, vomiting and abdominal pain.  Genitourinary: Negative for dysuria.  Musculoskeletal: Positive for neck pain. Negative  for back pain.  Skin: Negative for rash.  Neurological: Positive for light-headedness and headaches. Negative for weakness and numbness.  Hematological: Does not bruise/bleed easily.  Psychiatric/Behavioral: Negative for confusion.      Allergies  Review of patient's allergies indicates no known allergies.  Home Medications   Prior to Admission medications   Medication Sig Start Date End Date Taking? Authorizing Provider  traMADol (ULTRAM) 50 MG tablet Take 1 tablet (50 mg total) by mouth every 6 (six) hours as needed. 09/07/14   Vanetta MuldersScott Gonsalo Cuthbertson, MD   BP 124/72 mmHg  Pulse 76  Temp(Src) 99.4 F (37.4 C) (Oral)  Resp 18  Ht 5\' 4"  (1.626 m)  Wt 170 lb (77.111 kg)  BMI 29.17 kg/m2  SpO2 100% Physical Exam  Constitutional: She is oriented to person, place, and time. She appears well-developed and well-nourished. No distress.  HENT:  Head: Normocephalic and atraumatic.  Mouth/Throat: Oropharynx is clear and moist.  Eyes: Conjunctivae and EOM are normal. Pupils are equal, round, and reactive to light.  Neck: Normal range of motion. Neck supple.  Cardiovascular: Normal rate, regular rhythm and normal heart sounds.   Pulmonary/Chest: Effort normal and breath sounds normal. No respiratory distress.  Abdominal: Soft. Bowel sounds are normal. There is no tenderness.  Musculoskeletal: Normal range of motion.  Neurological: She is alert and oriented to person, place, and time. No cranial nerve deficit. She exhibits normal muscle tone. Coordination normal.  Skin: Skin is warm. No rash noted.  Nursing note and vitals reviewed.   ED Course  Procedures (including critical care time) Labs Review Labs Reviewed - No data to display  Imaging Review Ct Head Wo Contrast  09/07/2014   CLINICAL DATA:  Pt c/o headache today after tripping and falling at airport on Friday night, pt denies any LOC, pt states she hit her chest, hand and chin, only complaints today are a headache,  EXAM: CT HEAD  WITHOUT CONTRAST  CT CERVICAL SPINE WITHOUT CONTRAST  TECHNIQUE: Multidetector CT imaging of the head and cervical spine was performed following the standard protocol without intravenous contrast. Multiplanar CT image reconstructions of the cervical spine were also generated.  COMPARISON:  08/21/2008  FINDINGS: CT HEAD FINDINGS  Ventricles are normal in size and configuration. There are no parenchymal masses or mass effect. There is no evidence of an infarct. There are no extra-axial masses or abnormal fluid collections  There is no intracranial hemorrhage.  Visualized sinuses and mastoid air cells are clear. No skull fracture.  CT CERVICAL SPINE FINDINGS  No fracture. No spondylolisthesis. There is straightening of the normal cervical lordosis. Mild loss of disc height at C4-C5 and C6-C7, with moderate loss of disc height at C5-C6. No significant central stenosis or neural foraminal narrowing. No evidence of a disc herniation.  Mild prominence of the right lobe of the thyroid gland. Diffuse thyroid heterogeneity T likely from multiple ill-defined nodules. Soft tissues are otherwise unremarkable. Lung apices are clear.  IMPRESSION: HEAD CT:  No intracranial abnormality.  No skull fracture  CERVICAL CT:  No fracture or acute finding.   Electronically Signed   By: Amie Portland M.D.   On: 09/07/2014 11:00   Ct Cervical Spine Wo Contrast  09/07/2014   CLINICAL DATA:  Pt c/o headache today after tripping and falling at airport on Friday night, pt denies any LOC, pt states she hit her chest, hand and chin, only complaints today are a headache,  EXAM: CT HEAD WITHOUT CONTRAST  CT CERVICAL SPINE WITHOUT CONTRAST  TECHNIQUE: Multidetector CT imaging of the head and cervical spine was performed following the standard protocol without intravenous contrast. Multiplanar CT image reconstructions of the cervical spine were also generated.  COMPARISON:  08/21/2008  FINDINGS: CT HEAD FINDINGS  Ventricles are normal in size and  configuration. There are no parenchymal masses or mass effect. There is no evidence of an infarct. There are no extra-axial masses or abnormal fluid collections  There is no intracranial hemorrhage.  Visualized sinuses and mastoid air cells are clear. No skull fracture.  CT CERVICAL SPINE FINDINGS  No fracture. No spondylolisthesis. There is straightening of the normal cervical lordosis. Mild loss of disc height at C4-C5 and C6-C7, with moderate loss of disc height at C5-C6. No significant central stenosis or neural foraminal narrowing. No evidence of a disc herniation.  Mild prominence of the right lobe of the thyroid gland. Diffuse thyroid heterogeneity T likely from multiple ill-defined nodules. Soft tissues are otherwise unremarkable. Lung apices are clear.  IMPRESSION: HEAD CT:  No intracranial abnormality.  No skull fracture  CERVICAL CT:  No fracture or acute finding.   Electronically Signed   By: Amie Portland M.D.   On: 09/07/2014 11:00   Dg Hand Complete Right  09/07/2014   CLINICAL DATA:  Right hand pain after fall.  EXAM: RIGHT HAND - COMPLETE 3+ VIEW  COMPARISON:  None.  FINDINGS: There is no evidence of fracture or dislocation. There is no evidence of arthropathy or other  focal bone abnormality. Soft tissues are unremarkable.  IMPRESSION: Normal right hand.   Electronically Signed   By: Roque LiasJames  Green M.D.   On: 09/07/2014 10:58     EKG Interpretation None      MDM   Final diagnoses:  Fall  Concussion, without loss of consciousness, initial encounter  Hand contusion, right, initial encounter    Patient status post fall on Friday at the airport. Did strike her chin bruised and pain to her right hand. Now has headache and some neck pain. Head CT negative for any intracranial injuries or any skull fractures. CT of neck negative for any bony neck injury. Patient without any neuro deficits. X-ray of the right hand negative for any bony injury. Believe the patient is listening symptoms of  concussion. No other significant injuries.    Vanetta MuldersScott Kirin Brandenburger, MD 09/07/14 [phone]

## 2014-09-07 NOTE — ED Notes (Signed)
D/c home. Directed to pharmacy to pick up RX

## 2015-01-17 ENCOUNTER — Telehealth: Payer: Self-pay | Admitting: Family Medicine

## 2015-01-17 NOTE — Telephone Encounter (Signed)
Pre visit letter sent  °

## 2015-02-03 ENCOUNTER — Telehealth: Payer: Self-pay | Admitting: *Deleted

## 2015-02-03 NOTE — Telephone Encounter (Signed)
Unable to reach patient at time of Pre-Visit Call.  Left message for patient to return call when available.    

## 2015-02-04 ENCOUNTER — Ambulatory Visit (INDEPENDENT_AMBULATORY_CARE_PROVIDER_SITE_OTHER): Payer: Medicare Other | Admitting: Family Medicine

## 2015-02-04 ENCOUNTER — Encounter: Payer: Self-pay | Admitting: General Practice

## 2015-02-04 ENCOUNTER — Encounter: Payer: Self-pay | Admitting: Family Medicine

## 2015-02-04 VITALS — BP 122/78 | HR 85 | Temp 97.9°F | Resp 16 | Ht 64.5 in | Wt 177.2 lb

## 2015-02-04 DIAGNOSIS — E663 Overweight: Secondary | ICD-10-CM

## 2015-02-04 DIAGNOSIS — Z0000 Encounter for general adult medical examination without abnormal findings: Secondary | ICD-10-CM | POA: Insufficient documentation

## 2015-02-04 DIAGNOSIS — E669 Obesity, unspecified: Secondary | ICD-10-CM | POA: Insufficient documentation

## 2015-02-04 DIAGNOSIS — Z1211 Encounter for screening for malignant neoplasm of colon: Secondary | ICD-10-CM

## 2015-02-04 DIAGNOSIS — Z1231 Encounter for screening mammogram for malignant neoplasm of breast: Secondary | ICD-10-CM

## 2015-02-04 DIAGNOSIS — Z780 Asymptomatic menopausal state: Secondary | ICD-10-CM

## 2015-02-04 LAB — CBC WITH DIFFERENTIAL/PLATELET
Basophils Absolute: 0 10*3/uL (ref 0.0–0.1)
Basophils Relative: 0.6 % (ref 0.0–3.0)
Eosinophils Absolute: 0.1 10*3/uL (ref 0.0–0.7)
Eosinophils Relative: 1.4 % (ref 0.0–5.0)
HCT: 39.7 % (ref 36.0–46.0)
Hemoglobin: 13.5 g/dL (ref 12.0–15.0)
Lymphocytes Relative: 34.2 % (ref 12.0–46.0)
Lymphs Abs: 2.8 10*3/uL (ref 0.7–4.0)
MCHC: 34 g/dL (ref 30.0–36.0)
MCV: 89.9 fl (ref 78.0–100.0)
Monocytes Absolute: 0.6 10*3/uL (ref 0.1–1.0)
Monocytes Relative: 7.3 % (ref 3.0–12.0)
Neutro Abs: 4.6 10*3/uL (ref 1.4–7.7)
Neutrophils Relative %: 56.5 % (ref 43.0–77.0)
Platelets: 307 10*3/uL (ref 150.0–400.0)
RBC: 4.41 Mil/uL (ref 3.87–5.11)
RDW: 13.6 % (ref 11.5–15.5)
WBC: 8.1 10*3/uL (ref 4.0–10.5)

## 2015-02-04 LAB — LIPID PANEL
Cholesterol: 151 mg/dL (ref 0–200)
HDL: 46 mg/dL (ref >39.00)
LDL Cholesterol: 92 mg/dL (ref 0–99)
NonHDL: 105
Total CHOL/HDL Ratio: 3
Triglycerides: 67 mg/dL (ref 0.0–149.0)
VLDL: 13.4 mg/dL (ref 0.0–40.0)

## 2015-02-04 LAB — BASIC METABOLIC PANEL
BUN: 16 mg/dL (ref 6–23)
CO2: 27 meq/L (ref 19–32)
Calcium: 9.2 mg/dL (ref 8.4–10.5)
Chloride: 105 mEq/L (ref 96–112)
Creatinine, Ser: 0.71 mg/dL (ref 0.40–1.20)
GFR: 87.69 mL/min (ref >60.00)
GLUCOSE: 79 mg/dL (ref 70–99)
Potassium: 3.9 mEq/L (ref 3.5–5.1)
SODIUM: 138 meq/L (ref 135–145)

## 2015-02-04 LAB — HEPATIC FUNCTION PANEL
ALK PHOS: 55 U/L (ref 39–117)
ALT: 17 U/L (ref 0–35)
AST: 19 U/L (ref 0–37)
Albumin: 3.8 g/dL (ref 3.5–5.2)
BILIRUBIN TOTAL: 0.6 mg/dL (ref 0.2–1.2)
Bilirubin, Direct: 0.2 mg/dL (ref 0.0–0.3)
Total Protein: 7.1 g/dL (ref 6.0–8.3)

## 2015-02-04 NOTE — Assessment & Plan Note (Signed)
Pt's PE WNL w/ exception of abdominal obesity.  Pt is overdue for all health maintenance- order placed for mammo/DEXA, referral to GI placed.  Pt to schedule and return for pap.  Written screening schedule updated and given to pt.  Pt declines EKG today b/c she had one in Oct in ER.  Declines Prevnar and Pneumovax.  Check labs.  Anticipatory guidance provided.

## 2015-02-04 NOTE — Progress Notes (Signed)
Pre visit review using our clinic review tool, if applicable. No additional management support is needed unless otherwise documented below in the visit note. 

## 2015-02-04 NOTE — Assessment & Plan Note (Signed)
New to provider, ongoing for pt.  Discussed need for healthy diet and regular exercise.  Check labs to risk stratify.  Will follow.

## 2015-02-04 NOTE — Progress Notes (Signed)
   Subjective:    Patient ID: Joy Mosley, female    DOB: 06/28/1951, 64 y.o.   MRN: [id number]  HPI Here today for CPE.  Risk Factors: Overweight- ongoing issue for pt.  Is attempting to move more and make better food choices. Physical Activity: walking intermittently Fall Risk: low Depression: denies current sxs Hearing: normal to conversational tones and whispered voice at 6 ft ADL's: independent Cognitive: normal linear thought process, memory and attention intact Home Safety: safe at home Height, Weight, BMI, Visual Acuity: see vitals, vision corrected to 20/20 w/ glasses Counseling: overdue on colonoscopy (Dr Loreta AveMann), DEXA, [facility]o [facility]  Last pap was 5 yrs ago.  Declines Prevnar/Pneumovax.  Declines EKG Health care POA/Living Will: pt has both in place Labs Ordered: See A&P Care Plan: See A&P    Review of Systems Patient reports no vision/ hearing changes, adenopathy,fever, weight change,  persistant/recurrent hoarseness , swallowing issues, chest pain, palpitations, edema, persistant/recurrent cough, hemoptysis, dyspnea (rest/exertional/paroxysmal nocturnal), gastrointestinal bleeding (melena, rectal bleeding), abdominal pain, significant heartburn, bowel changes, GU symptoms (dysuria, hematuria, incontinence), Gyn symptoms (abnormal  bleeding, pain),  syncope, focal weakness, memory loss, numbness & tingling, skin/hair/nail changes, abnormal bruising or bleeding, anxiety, or depression.     Objective:   Physical Exam General Appearance:    Alert, cooperative, no distress, appears stated age  Head:    Normocephalic, without obvious abnormality, atraumatic  Eyes:    PERRL, conjunctiva/corneas clear, EOM's intact, fundi    benign, both eyes  Ears:    Normal TM's and external ear canals, both ears  Nose:   Nares normal, septum midline, mucosa normal, no drainage    or sinus tenderness  Throat:   Lips, mucosa, and tongue normal; teeth and gums normal  Neck:    Supple, symmetrical, trachea midline, no adenopathy;    Thyroid: no enlargement/tenderness/nodules  Back:     Symmetric, no curvature, ROM normal, no CVA tenderness  Lungs:     Clear to auscultation bilaterally, respirations unlabored  Chest Wall:    No tenderness or deformity   Heart:    Regular rate and rhythm, S1 and S2 normal, no murmur, rub   or gallop  Breast Exam:    Deferred to mammo  Abdomen:     Soft, non-tender, bowel sounds active all four quadrants,    no masses, no organomegaly  Genitalia:    Deferred  Rectal:    Extremities:   Extremities normal, atraumatic, no cyanosis or edema  Pulses:   2+ and symmetric all extremities  Skin:   Skin color, texture, turgor normal, no rashes or lesions  Lymph nodes:   Cervical, supraclavicular, and axillary nodes normal  Neurologic:   CNII-XII intact, normal strength, sensation and reflexes    throughout          Assessment & Plan:

## 2015-02-04 NOTE — Patient Instructions (Signed)
Schedule a pap at your convenience (15 minute appt) We'll notify you of your lab results and make any changes if needed We'll call you with your GI appt as well as your mammo and bone density appts Try and make healthy food choices and get regular exercise- you can do this! Call with any questions or concerns [place]!

## 2015-02-17 ENCOUNTER — Encounter: Payer: Self-pay | Admitting: Family Medicine

## 2015-03-11 ENCOUNTER — Ambulatory Visit: Payer: Medicare Other | Admitting: Family Medicine

## 2016-04-06 ENCOUNTER — Emergency Department (HOSPITAL_BASED_OUTPATIENT_CLINIC_OR_DEPARTMENT_OTHER)
Admission: EM | Admit: 2016-04-06 | Discharge: 2016-04-06 | Disposition: A | Discharge status: Home / Self Care | Payer: Medicare Other | Attending: Emergency Medicine | Admitting: Emergency Medicine

## 2016-04-06 ENCOUNTER — Emergency Department (HOSPITAL_BASED_OUTPATIENT_CLINIC_OR_DEPARTMENT_OTHER): Payer: Medicare Other

## 2016-04-06 ENCOUNTER — Encounter (HOSPITAL_BASED_OUTPATIENT_CLINIC_OR_DEPARTMENT_OTHER): Payer: Self-pay | Admitting: Emergency Medicine

## 2016-04-06 DIAGNOSIS — G44209 Tension-type headache, unspecified, not intractable: Secondary | ICD-10-CM | POA: Insufficient documentation

## 2016-04-06 DIAGNOSIS — R109 Unspecified abdominal pain: Secondary | ICD-10-CM | POA: Diagnosis not present

## 2016-04-06 DIAGNOSIS — R51 Headache: Secondary | ICD-10-CM | POA: Diagnosis present

## 2016-04-06 HISTORY — DX: Diverticulitis of intestine, part unspecified, without perforation or abscess without bleeding: K57.92

## 2016-04-06 LAB — CBC WITH DIFFERENTIAL/PLATELET
BASOS ABS: 0 10*3/uL (ref 0.0–0.1)
Basophils Relative: 0 %
EOS ABS: 0.1 10*3/uL (ref 0.0–0.7)
Eosinophils Relative: 2 %
HEMATOCRIT: 39.3 % (ref 36.0–46.0)
HEMOGLOBIN: 13.2 g/dL (ref 12.0–15.0)
Lymphocytes Relative: 24 %
Lymphs Abs: 1.4 10*3/uL (ref 0.7–4.0)
MCH: 30.1 pg (ref 26.0–34.0)
MCHC: 33.6 g/dL (ref 30.0–36.0)
MCV: 89.5 fL (ref 78.0–100.0)
MONOS PCT: 11 %
Monocytes Absolute: 0.7 10*3/uL (ref 0.1–1.0)
Neutro Abs: 3.7 10*3/uL (ref 1.7–7.7)
Neutrophils Relative %: 63 %
Platelets: 303 10*3/uL (ref 150–400)
RBC: 4.39 MIL/uL (ref 3.87–5.11)
RDW: 13.2 % (ref 11.5–15.5)
WBC: 6 10*3/uL (ref 4.0–10.5)

## 2016-04-06 LAB — BASIC METABOLIC PANEL
ANION GAP: 5 (ref 5–15)
BUN: 20 mg/dL (ref 6–20)
CALCIUM: 8.8 mg/dL — AB (ref 8.9–10.3)
CO2: 25 mmol/L (ref 22–32)
Chloride: 108 mmol/L (ref 101–111)
Creatinine, Ser: 0.6 mg/dL (ref 0.44–1.00)
GFR calc non Af Amer: >60 mL/min (ref >60)
Glucose, Bld: 96 mg/dL (ref 65–99)
Potassium: 4 mmol/L (ref 3.5–5.1)
SODIUM: 138 mmol/L (ref 135–145)

## 2016-04-06 MED ORDER — KETOROLAC TROMETHAMINE 60 MG/2ML IM SOLN
60.0000 mg | Freq: Once | INTRAMUSCULAR | Status: AC
  Administered 2016-04-06: 60 mg via INTRAMUSCULAR
  Filled 2016-04-06: qty 2

## 2016-04-06 NOTE — ED Notes (Signed)
Patient reports persistent headache since 1400 yesterday.  Reports pressure in the top of her head.  Denies nausea/vomiting.  Reports intermittent dizziness.

## 2016-04-06 NOTE — ED Provider Notes (Signed)
CSN: [id number]     Arrival date & time 04/06/16  7829 History   First MD Initiated Contact with Patient 04/06/16 [phone]     Chief Complaint  Patient presents with  . Headache     (Consider location/radiation/quality/duration/timing/severity/associated sxs/prior Treatment) Patient is a 65 y.o. female presenting with headaches. The history is provided by the patient.  Headache Pain location:  Frontal Quality:  Sharp and stabbing Radiates to:  Does not radiate Severity currently:  8/10 Severity at highest:  8/10 Onset quality:  Gradual Duration:  2 days Timing:  Constant Progression:  Partially resolved Chronicity:  New Similar to prior headaches: no   Context: not activity   Relieved by:  Nothing Worsened by:  Nothing Ineffective treatments:  None tried Associated symptoms: abdominal pain   Associated symptoms: no nausea   Risk factors: no anger   Pt complains of pain in the right side of her head.  Pt reports she has had headaches but not like this.  Pt reports she felt dizzy earlier .  No current ED.  Past Medical History  Diagnosis Date  . Diverticulitis    Past Surgical History  Procedure Laterality Date  . Ovarian cyst removal  1980  . Tonsillectomy and adenoidectomy     Family History  Problem Relation Age of Onset  . Leukemia Mother   . Cancer Father     prostate  . Cancer Maternal Grandfather    Social History  Substance Use Topics  . Smoking status: Never Smoker   . Smokeless tobacco: Never Used  . Alcohol Use: No     Comment: rarely   OB History    No data available     Review of Systems  Gastrointestinal: Positive for abdominal pain. Negative for nausea.  Neurological: Positive for headaches.  All other systems reviewed and are negative.     Allergies  Review of patient's allergies indicates no known allergies.  Home Medications   Prior to Admission medications   Not on File   BP 127/84 mmHg  Pulse 79  Temp(Src) 98.4 F (36.9 C)  (Oral)  Resp 16  Ht 5' 4.5" (1.638 m)  Wt 72.576 kg  BMI 27.05 kg/m2  SpO2 97% Physical Exam  Constitutional: She is oriented to person, place, and time. She appears well-developed and well-nourished.  HENT:  Head: Normocephalic.  Right Ear: External ear normal.  Left Ear: External ear normal.  Mouth/Throat: Oropharynx is clear and moist.  Eyes: Conjunctivae and EOM are normal. Pupils are equal, round, and reactive to light.  Neck: Normal range of motion.  Cardiovascular: Normal rate.   Pulmonary/Chest: Effort normal.  Abdominal: Soft. She exhibits no distension.  Musculoskeletal: Normal range of motion.  Neurological: She is alert and oriented to person, place, and time.  Skin: Skin is warm.  Psychiatric: She has a normal mood and affect.  Nursing note and vitals reviewed.   ED Course  Procedures (including critical care time) Labs Review Labs Reviewed  BASIC METABOLIC PANEL - Abnormal; Notable for the following:    Calcium 8.8 (*)    All other components within normal limits  CBC WITH DIFFERENTIAL/PLATELET    Imaging Review Ct Head Wo Contrast  04/06/2016  CLINICAL DATA:  Right lateral and vertex area headache for 1 day. EXAM: CT HEAD WITHOUT CONTRAST TECHNIQUE: Contiguous axial images were obtained from the base of the skull through the vertex without intravenous contrast. COMPARISON:  September 07, 2014 FINDINGS: The ventricles are normal in size and  configuration. There is slight cerebellar atrophy. There is an asymmetric perivascular space in the medial left temporal lobe, stable. There is no intracranial mass, hemorrhage, extra-axial fluid collection, or midline shift. The gray-white compartments appear unremarkable. No acute infarct is evident. The bony calvarium appears intact. The mastoid air cells are clear. Orbits appear symmetric bilaterally. IMPRESSION: Slight cerebellar atrophy. Ventricles normal in size and configuration. No intracranial mass, hemorrhage, or focal  gray - white compartment lesions/acute appearing infarct. Electronically Signed   By: Bretta BangWilliam  Woodruff III M.D.   On: 04/06/2016 11:09   I have personally reviewed and evaluated these images and lab results as part of my medical decision-making.   EKG Interpretation None      MDM   Final diagnoses:  Tension-type headache, not intractable, unspecified chronicity pattern     Current facility-administered medications:  .  ketorolac (TORADOL) injection 60 mg, 60 mg, Intramuscular, Once, Elson AreasLeslie K Quintell Bonnin, PA-C No current outpatient prescriptions on file.    Lonia SkinnerLeslie K BovinaSofia, PA-C 04/06/16 1201  Alvira MondayErin Schlossman, MD 04/08/16 [phone]

## 2016-04-06 NOTE — Discharge Instructions (Signed)
Tension Headache A tension headache is a feeling of pain, pressure, or aching that is often felt over the front and sides of the head. The pain can be dull, or it can feel tight (constricting). Tension headaches are not normally associated with nausea or vomiting, and they do not get worse with physical activity. Tension headaches can last from 30 minutes to several days. This is the most common type of headache. CAUSES The exact cause of this condition is not known. Tension headaches often begin after stress, anxiety, or depression. Other triggers may include:  Alcohol.  Too much caffeine, or caffeine withdrawal.  Respiratory infections, such as colds, flu, or sinus infections.  Dental problems or teeth clenching.  Fatigue.  Holding your head and neck in the same position for a long period of time, such as while using a computer.  Smoking. SYMPTOMS Symptoms of this condition include:  A feeling of pressure around the head.  Dull, aching head pain.  Pain felt over the front and sides of the head.  Tenderness in the muscles of the head, neck, and shoulders. DIAGNOSIS This condition may be diagnosed based on your symptoms and a physical exam. Tests may be done, such as a CT scan or an MRI of your head. These tests may be done if your symptoms are severe or unusual. TREATMENT This condition may be treated with lifestyle changes and medicines to help relieve symptoms. HOME CARE INSTRUCTIONS Managing Pain  Take over-the-counter and prescription medicines only as told by your health care provider.  Lie down in a dark, quiet room when you have a headache.  If directed, apply ice to the head and neck area:  Put ice in a plastic bag.  Place a towel between your skin and the bag.  Leave the ice on for 20 minutes, 2-3 times per day.  Use a heating pad or a hot shower to apply heat to the head and neck area as told by your health care provider. Eating and Drinking  Eat meals on  a regular schedule.  Limit alcohol use.  Decrease your caffeine intake, or stop using caffeine. General Instructions  Keep all follow-up visits as told by your health care provider. This is important.  Keep a headache journal to help find out what may trigger your headaches. For example, write down:  What you eat and drink.  How much sleep you get.  Any change to your diet or medicines.  Try massage or other relaxation techniques.  Limit stress.  Sit up straight, and avoid tensing your muscles.  Do not use tobacco products, including cigarettes, chewing tobacco, or e-cigarettes. If you need help quitting, ask your health care provider.  Exercise regularly as told by your health care provider.  Get 7-9 hours of sleep, or the amount recommended by your health care provider. SEEK MEDICAL CARE IF:  Your symptoms are not helped by medicine.  You have a headache that is different from what you normally experience.  You have nausea or you vomit.  You have a fever. SEEK IMMEDIATE MEDICAL CARE IF:  Your headache becomes severe.  You have repeated vomiting.  You have a stiff neck.  You have a loss of vision.  You have problems with speech.  You have pain in your eye or ear.  You have muscular weakness or loss of muscle control.  You lose your balance or you have trouble walking.  You feel faint or you pass out.  You have confusion.     This information is not intended to replace advice given to you by your health care provider. Make sure you discuss any questions you have with your health care provider.   Document Released: 10/08/2005 Document Revised: 06/29/2015 Document Reviewed: 01/31/2015 Elsevier Interactive Patient Education 2016 [employer]  

## 2016-06-14 ENCOUNTER — Other Ambulatory Visit: Payer: Self-pay | Admitting: Family Medicine

## 2016-06-14 DIAGNOSIS — Z1231 Encounter for screening mammogram for malignant neoplasm of breast: Secondary | ICD-10-CM

## 2016-06-19 ENCOUNTER — Ambulatory Visit: Payer: Medicare Other

## 2016-06-21 ENCOUNTER — Ambulatory Visit
Admission: RE | Admit: 2016-06-21 | Discharge: 2016-06-21 | Disposition: A | Discharge status: Home / Self Care | Payer: Medicare Other | Source: Ambulatory Visit | Attending: Family Medicine | Admitting: Family Medicine

## 2016-06-21 DIAGNOSIS — Z1231 Encounter for screening mammogram for malignant neoplasm of breast: Secondary | ICD-10-CM

## 2016-07-03 DIAGNOSIS — H43811 Vitreous degeneration, right eye: Secondary | ICD-10-CM | POA: Diagnosis not present

## 2016-07-03 DIAGNOSIS — H2513 Age-related nuclear cataract, bilateral: Secondary | ICD-10-CM | POA: Diagnosis not present

## 2016-07-10 DIAGNOSIS — H43811 Vitreous degeneration, right eye: Secondary | ICD-10-CM | POA: Diagnosis not present

## 2016-08-21 DIAGNOSIS — H43813 Vitreous degeneration, bilateral: Secondary | ICD-10-CM | POA: Diagnosis not present

## 2016-08-21 DIAGNOSIS — H5203 Hypermetropia, bilateral: Secondary | ICD-10-CM | POA: Diagnosis not present

## 2016-08-27 DIAGNOSIS — H33191 Other retinoschisis and retinal cysts, right eye: Secondary | ICD-10-CM | POA: Diagnosis not present

## 2016-08-27 DIAGNOSIS — H35411 Lattice degeneration of retina, right eye: Secondary | ICD-10-CM | POA: Diagnosis not present

## 2016-08-27 DIAGNOSIS — H43811 Vitreous degeneration, right eye: Secondary | ICD-10-CM | POA: Diagnosis not present

## 2016-09-07 ENCOUNTER — Telehealth: Payer: Self-pay | Admitting: Family Medicine

## 2016-09-07 NOTE — Telephone Encounter (Signed)
Called Mrs. Kataoka to schedule appt for awv. Left msg for patient to call office to schedule appt.

## 2016-10-24 DIAGNOSIS — H43811 Vitreous degeneration, right eye: Secondary | ICD-10-CM | POA: Diagnosis not present

## 2016-10-24 DIAGNOSIS — H33191 Other retinoschisis and retinal cysts, right eye: Secondary | ICD-10-CM | POA: Diagnosis not present

## 2016-10-24 DIAGNOSIS — H35411 Lattice degeneration of retina, right eye: Secondary | ICD-10-CM | POA: Diagnosis not present

## 2017-01-08 ENCOUNTER — Telehealth: Payer: Self-pay

## 2017-01-08 NOTE — Telephone Encounter (Signed)
LM requesting CB to schedule AWV with [facility].  

## 2017-05-08 ENCOUNTER — Telehealth: Payer: Self-pay

## 2017-05-08 NOTE — Telephone Encounter (Signed)
LM requesting call back to schedule AWV with [facility] and f/u visit with PCP.

## 2017-09-05 ENCOUNTER — Other Ambulatory Visit: Payer: Self-pay | Admitting: Family Medicine

## 2017-09-05 DIAGNOSIS — Z1231 Encounter for screening mammogram for malignant neoplasm of breast: Secondary | ICD-10-CM

## 2017-10-08 ENCOUNTER — Ambulatory Visit
Admission: RE | Admit: 2017-10-08 | Discharge: 2017-10-08 | Disposition: A | Discharge status: Home / Self Care | Payer: Medicare Other | Source: Ambulatory Visit | Attending: Family Medicine | Admitting: Family Medicine

## 2017-10-08 DIAGNOSIS — Z1231 Encounter for screening mammogram for malignant neoplasm of breast: Secondary | ICD-10-CM

## 2017-10-09 ENCOUNTER — Encounter: Payer: Self-pay | Admitting: General Practice

## 2018-04-16 ENCOUNTER — Ambulatory Visit: Payer: Medicare Other | Admitting: Family Medicine

## 2018-04-16 DIAGNOSIS — Z0289 Encounter for other administrative examinations: Secondary | ICD-10-CM

## 2018-08-28 ENCOUNTER — Other Ambulatory Visit: Payer: Self-pay | Admitting: Family Medicine

## 2018-08-28 DIAGNOSIS — Z1231 Encounter for screening mammogram for malignant neoplasm of breast: Secondary | ICD-10-CM

## 2018-10-09 ENCOUNTER — Ambulatory Visit
Admission: RE | Admit: 2018-10-09 | Discharge: 2018-10-09 | Disposition: A | Discharge status: Home / Self Care | Payer: Medicare Other | Source: Ambulatory Visit | Attending: Family Medicine | Admitting: Family Medicine

## 2018-10-09 DIAGNOSIS — Z1231 Encounter for screening mammogram for malignant neoplasm of breast: Secondary | ICD-10-CM

## 2018-11-19 ENCOUNTER — Emergency Department (HOSPITAL_COMMUNITY): Payer: Medicare Other

## 2018-11-19 ENCOUNTER — Encounter (HOSPITAL_COMMUNITY): Payer: Self-pay | Admitting: *Deleted

## 2018-11-19 ENCOUNTER — Inpatient Hospital Stay (HOSPITAL_COMMUNITY)
Admission: EM | Admit: 2018-11-19 | Discharge: 2018-11-26 | DRG: 417 | Disposition: A | Discharge status: Home / Self Care | Payer: Medicare Other | Attending: Internal Medicine | Admitting: Internal Medicine

## 2018-11-19 ENCOUNTER — Other Ambulatory Visit: Payer: Self-pay

## 2018-11-19 DIAGNOSIS — Z419 Encounter for procedure for purposes other than remedying health state, unspecified: Secondary | ICD-10-CM

## 2018-11-19 DIAGNOSIS — R1011 Right upper quadrant pain: Secondary | ICD-10-CM | POA: Diagnosis not present

## 2018-11-19 DIAGNOSIS — Z7982 Long term (current) use of aspirin: Secondary | ICD-10-CM

## 2018-11-19 DIAGNOSIS — K859 Acute pancreatitis without necrosis or infection, unspecified: Secondary | ICD-10-CM | POA: Diagnosis present

## 2018-11-19 DIAGNOSIS — E876 Hypokalemia: Secondary | ICD-10-CM | POA: Diagnosis not present

## 2018-11-19 DIAGNOSIS — R059 Cough, unspecified: Secondary | ICD-10-CM

## 2018-11-19 DIAGNOSIS — E872 Acidosis: Secondary | ICD-10-CM | POA: Diagnosis present

## 2018-11-19 DIAGNOSIS — R9389 Abnormal findings on diagnostic imaging of other specified body structures: Secondary | ICD-10-CM

## 2018-11-19 DIAGNOSIS — R05 Cough: Secondary | ICD-10-CM

## 2018-11-19 DIAGNOSIS — K851 Biliary acute pancreatitis without necrosis or infection: Principal | ICD-10-CM | POA: Diagnosis present

## 2018-11-19 DIAGNOSIS — K811 Chronic cholecystitis: Secondary | ICD-10-CM | POA: Diagnosis present

## 2018-11-19 DIAGNOSIS — J9601 Acute respiratory failure with hypoxia: Secondary | ICD-10-CM | POA: Diagnosis not present

## 2018-11-19 DIAGNOSIS — J90 Pleural effusion, not elsewhere classified: Secondary | ICD-10-CM | POA: Diagnosis present

## 2018-11-19 LAB — LIPID PANEL
Cholesterol: 185 mg/dL (ref 0–200)
HDL: 48 mg/dL (ref >40)
LDL Cholesterol: 107 mg/dL — ABNORMAL HIGH (ref 0–99)
Total CHOL/HDL Ratio: 3.9 RATIO
Triglycerides: 148 mg/dL (ref <150)
VLDL: 30 mg/dL (ref 0–40)

## 2018-11-19 LAB — URINALYSIS, ROUTINE W REFLEX MICROSCOPIC
Bilirubin Urine: NEGATIVE
Glucose, UA: NEGATIVE mg/dL
Hgb urine dipstick: NEGATIVE
Ketones, ur: NEGATIVE mg/dL
Leukocytes, UA: NEGATIVE
NITRITE: NEGATIVE
Protein, ur: NEGATIVE mg/dL
Specific Gravity, Urine: 1.015 (ref 1.005–1.030)
pH: 5 (ref 5.0–8.0)

## 2018-11-19 LAB — COMPREHENSIVE METABOLIC PANEL
ALT: 56 U/L — ABNORMAL HIGH (ref 0–44)
AST: 112 U/L — ABNORMAL HIGH (ref 15–41)
Albumin: 3.6 g/dL (ref 3.5–5.0)
Alkaline Phosphatase: 71 U/L (ref 38–126)
Anion gap: 9 (ref 5–15)
BUN: 17 mg/dL (ref 8–23)
CO2: 24 mmol/L (ref 22–32)
Calcium: 8.7 mg/dL — ABNORMAL LOW (ref 8.9–10.3)
Chloride: 105 mmol/L (ref 98–111)
Creatinine, Ser: 0.67 mg/dL (ref 0.44–1.00)
GFR calc Af Amer: >60 mL/min (ref >60)
GFR calc non Af Amer: >60 mL/min (ref >60)
Glucose, Bld: 127 mg/dL — ABNORMAL HIGH (ref 70–99)
Potassium: 4.1 mmol/L (ref 3.5–5.1)
Sodium: 138 mmol/L (ref 135–145)
Total Bilirubin: 0.7 mg/dL (ref 0.3–1.2)
Total Protein: 6.5 g/dL (ref 6.5–8.1)

## 2018-11-19 LAB — I-STAT TROPONIN, ED: Troponin i, poc: 0 ng/mL (ref 0.00–0.08)

## 2018-11-19 LAB — CBC
HCT: 41.4 % (ref 36.0–46.0)
Hemoglobin: 13.4 g/dL (ref 12.0–15.0)
MCH: 29.5 pg (ref 26.0–34.0)
MCHC: 32.4 g/dL (ref 30.0–36.0)
MCV: 91.2 fL (ref 80.0–100.0)
Platelets: 273 10*3/uL (ref 150–400)
RBC: 4.54 MIL/uL (ref 3.87–5.11)
RDW: 13.2 % (ref 11.5–15.5)
WBC: 7.3 10*3/uL (ref 4.0–10.5)
nRBC: 0 % (ref 0.0–0.2)

## 2018-11-19 LAB — LIPASE, BLOOD: Lipase: 3377 U/L — ABNORMAL HIGH (ref 11–51)

## 2018-11-19 MED ORDER — SODIUM CHLORIDE 0.9% FLUSH: 3.0000 mL | Freq: Once | INTRAVENOUS | Status: DC

## 2018-11-19 MED ORDER — HYDROMORPHONE HCL 1 MG/ML IJ SOLN
0.5000 mg | Freq: Once | INTRAMUSCULAR | Status: AC
  Administered 2018-11-19: 0.5 mg via INTRAVENOUS
  Filled 2018-11-19: qty 1

## 2018-11-19 MED ORDER — SODIUM CHLORIDE 0.9 % IV SOLN
INTRAVENOUS | Status: AC
  Administered 2018-11-19 – 2018-11-20 (×2): via INTRAVENOUS

## 2018-11-19 MED ORDER — ENOXAPARIN SODIUM 40 MG/0.4ML ~~LOC~~ SOLN
40.0000 mg | SUBCUTANEOUS | Status: DC
  Administered 2018-11-21 – 2018-11-24 (×4): 40 mg via SUBCUTANEOUS
  Filled 2018-11-19 (×5): qty 0.4

## 2018-11-19 MED ORDER — FENTANYL CITRATE (PF) 100 MCG/2ML IJ SOLN
50.0000 ug | Freq: Once | INTRAMUSCULAR | Status: AC
  Administered 2018-11-19: 50 ug via INTRAVENOUS
  Filled 2018-11-19: qty 2

## 2018-11-19 MED ORDER — SODIUM CHLORIDE 0.9 % IV BOLUS
1000.0000 mL | Freq: Once | INTRAVENOUS | Status: AC
  Administered 2018-11-19: 1000 mL via INTRAVENOUS

## 2018-11-19 MED ORDER — ONDANSETRON HCL 4 MG/2ML IJ SOLN
4.0000 mg | Freq: Four times a day (QID) | INTRAMUSCULAR | Status: DC | PRN
  Administered 2018-11-19 – 2018-11-22 (×4): 4 mg via INTRAVENOUS
  Filled 2018-11-19 (×4): qty 2

## 2018-11-19 MED ORDER — HYDROMORPHONE HCL 1 MG/ML IJ SOLN
0.5000 mg | INTRAMUSCULAR | Status: DC | PRN
  Administered 2018-11-20 – 2018-11-22 (×4): 0.5 mg via INTRAVENOUS
  Filled 2018-11-19 (×4): qty 1

## 2018-11-19 MED ORDER — ONDANSETRON HCL 4 MG/2ML IJ SOLN
4.0000 mg | Freq: Once | INTRAMUSCULAR | Status: AC
  Administered 2018-11-19: 4 mg via INTRAVENOUS
  Filled 2018-11-19: qty 2

## 2018-11-19 NOTE — ED Notes (Signed)
Lab to add on lipid panel and triglycerides.

## 2018-11-19 NOTE — ED Provider Notes (Signed)
Emergency Department Provider Note   I have reviewed the triage vital signs and the nursing notes.   HISTORY  Chief Complaint Abdominal Pain   HPI Joy Mosley is a 68 y.o. female without significant past medical history presents the emergency department today secondary to abdominal pain.  Patient states that she has epigastric pain that started this morning progressively worsened throughout the day.  She has some associated nausea but no vomiting.  It radiates to her back and her right upper quadrant this time.  I had a denied this before.  Not related to eating.  Not worse with movement or better with rest.  No positional symptoms.  No significant past medical history and does not take any medications. No other associated or modifying symptoms.    Past Medical History:  Diagnosis Date  . Diverticulitis     Patient Active Problem List   Diagnosis Date Noted  . Acute pancreatitis 11/19/2018  . Physical exam 02/04/2015  . Overweight (BMI 25.0-29.9) 02/04/2015  . Fatigue 08/05/2014  . Slow transit constipation 08/12/2012  . FATIGUE 03/02/2009  . ABDOMINAL PAIN, LEFT LOWER QUADRANT 03/02/2009  . DIVERTICULITIS, HX OF 03/02/2009    Past Surgical History:  Procedure Laterality Date  . BREAST CYST ASPIRATION    . OVARIAN CYST REMOVAL  1980  . TONSILLECTOMY AND ADENOIDECTOMY      Current Outpatient Rx  . Order #: [id number] Class: Historical Med    Allergies Patient has no known allergies.  Family History  Problem Relation Age of Onset  . Leukemia Mother   . Cancer Father        prostate  . Cancer Maternal Grandfather   . Breast cancer Neg Hx     Social History Social History   Tobacco Use  . Smoking status: Never Smoker  . Smokeless tobacco: Never Used  Substance Use Topics  . Alcohol use: No    Comment: rarely  . Drug use: No    Review of Systems  All other systems negative except as documented in the HPI. All pertinent positives and negatives as  reviewed in the HPI. ____________________________________________   PHYSICAL EXAM:  VITAL SIGNS: ED Triage Vitals  Enc Vitals Group     BP 11/19/18 1459 119/71     Pulse Rate 11/19/18 1459 88     Resp 11/19/18 1459 18     Temp 11/19/18 1459 97.9 F (36.6 C)     Temp Source 11/19/18 1459 Oral     SpO2 11/19/18 1459 100 %     Weight 11/19/18 1550 165 lb (74.8 kg)     Height 11/19/18 1550 5\' 4"  (1.626 m)    Constitutional: Alert and oriented. Well appearing and in no moderately distressed secondary to pain.   Eyes: Conjunctivae are normal. PERRL. EOMI. Head: Atraumatic. Nose: No congestion/rhinnorhea. Mouth/Throat: Mucous membranes are moist.  Oropharynx non-erythematous. Neck: No stridor.  No meningeal signs.   Cardiovascular: Normal rate, regular rhythm. Good peripheral circulation. Grossly normal heart sounds.   Respiratory: Normal respiratory effort.  No retractions. Lungs CTAB. Gastrointestinal: Soft and nontender. No distention.  Musculoskeletal: No lower extremity tenderness nor edema. No gross deformities of extremities. Neurologic:  Normal speech and language. No gross focal neurologic deficits are appreciated.  Skin:  Skin is warm, dry and intact. No rash noted.   ____________________________________________   LABS (all labs ordered are listed, but only abnormal results are displayed)  Labs Reviewed  LIPASE, BLOOD - Abnormal; Notable for the following components:  Result Value   Lipase 3,377 (*)    All other components within normal limits  COMPREHENSIVE METABOLIC PANEL - Abnormal; Notable for the following components:   Glucose, Bld 127 (*)    Calcium 8.7 (*)    AST 112 (*)    ALT 56 (*)    All other components within normal limits  LIPID PANEL - Abnormal; Notable for the following components:   LDL Cholesterol 107 (*)    All other components within normal limits  CBC  URINALYSIS, ROUTINE W REFLEX MICROSCOPIC  I-STAT TROPONIN, ED    ____________________________________________  EKG   EKG Interpretation  Date/Time:  Wednesday November 19 2018 14:59:22 EST Ventricular Rate:  80 PR Interval:  134 QRS Duration: 68 QT Interval:  368 QTC Calculation: 424 R Axis:   43 Text Interpretation:  Normal sinus rhythm Normal ECG No significant change since last tracing Confirmed by Marily Memos [phone]) on 11/19/2018 4:07:36 PM       ____________________________________________  RADIOLOGY  Dg Chest 2 View  Result Date: 11/19/2018 CLINICAL DATA:  Bilateral breast pain EXAM: CHEST - 2 VIEW COMPARISON:  08/08/2014 FINDINGS: Cardiac shadow is within normal limits. Lungs are well aerated bilaterally. No focal infiltrate or sizable effusion is seen. No bony abnormality is noted. IMPRESSION: No acute abnormality noted. Electronically Signed   By: Alcide Clever M.D.   On: 11/19/2018 15:49   US Abdomen Limited Ruq  Result Date: 11/19/2018 CLINICAL DATA:  Right upper quadrant pain EXAM: ULTRASOUND ABDOMEN LIMITED RIGHT UPPER QUADRANT COMPARISON:  None. FINDINGS: Gallbladder: Numerous small echogenic shadowing gallstones. Normal wall thickness measuring 2 mm. No Murphy's sign. Gallstones measure 5 mm or less in size. Common bile duct: Diameter: 3.3 mm Liver: Mild heterogeneous echotexture without focal abnormality. No biliary dilatation. No surrounding free fluid or ascites. Portal vein is patent on color Doppler imaging with normal direction of blood flow towards the liver. IMPRESSION: Cholelithiasis.  No other acute finding by ultrasound. Electronically Signed   By: Judie Petit.  Shick M.D.   On: 11/19/2018 20:10    ____________________________________________   INITIAL IMPRESSION / ASSESSMENT AND PLAN / ED COURSE  Patient had eaten just a couple hours prior to the start symptoms or concerns were for gallbladder versus pancreatitis.  Does not drink alcohol has no other medical problems so gallbladder more likely.  Also consider possible  cardiac causes secondary to the location and the description of the discomfort however EKG was negative and troponin is negative.  Lipase returned over 3000 and mild elevation of liver enzymes concern for possible choledocholithiasis.  Pending ultrasound.  We will continue to give the medication and nausea medication. Add on triglycerides and likely needs admit.   Ultrasound with cholelithiasis but normal gallbladder wall and common bile duct.  Discussed with gastroenterology, Dr. Loreta Ave, who recommends MRCP in the morning and she will evaluate in the morning.  Discussed with [facility] who will admit.  Pertinent labs & imaging results that were available during my care of the patient were reviewed by me and considered in my medical decision making (see chart for details).  ____________________________________________  FINAL CLINICAL IMPRESSION(S) / ED DIAGNOSES  Final diagnoses:  RUQ pain  Acute pancreatitis, unspecified complication status, unspecified pancreatitis type     MEDICATIONS GIVEN DURING THIS VISIT:  Medications  sodium chloride flush (NS) 0.9 % injection 3 mL (3 mLs Intravenous Not Given 11/19/18 1529)  fentaNYL (SUBLIMAZE) injection 50 mcg (50 mcg Intravenous Given 11/19/18 1647)  sodium chloride 0.9 %  bolus 1,000 mL (0 mLs Intravenous Stopped 11/19/18 1735)  ondansetron (ZOFRAN) injection 4 mg (4 mg Intravenous Given 11/19/18 1736)  HYDROmorphone (DILAUDID) injection 0.5 mg (0.5 mg Intravenous Given 11/19/18 1831)     NEW OUTPATIENT MEDICATIONS STARTED DURING THIS VISIT:  New Prescriptions   No medications on file    Note:  This note was prepared with assistance of Dragon voice recognition software. Occasional wrong-word or sound-a-like substitutions may have occurred due to the inherent limitations of voice recognition software.   Marily MemosMesner, Vergil Burby, MD 11/19/18 2114

## 2018-11-19 NOTE — ED Notes (Signed)
ED Provider at bedside. 

## 2018-11-19 NOTE — ED Triage Notes (Signed)
Pt in via EMS c/o sudden onset of upper abdominal pain that radiates into her back, shortness of breath with this, denies n/v, did have some light headedness, describes pain as squeezing, no distress noted, IV in place on arrival 20g L [facility]

## 2018-11-20 ENCOUNTER — Observation Stay (HOSPITAL_COMMUNITY): Payer: Medicare Other

## 2018-11-20 DIAGNOSIS — E876 Hypokalemia: Secondary | ICD-10-CM | POA: Diagnosis not present

## 2018-11-20 DIAGNOSIS — K859 Acute pancreatitis without necrosis or infection, unspecified: Secondary | ICD-10-CM

## 2018-11-20 DIAGNOSIS — D72829 Elevated white blood cell count, unspecified: Secondary | ICD-10-CM | POA: Diagnosis not present

## 2018-11-20 DIAGNOSIS — R945 Abnormal results of liver function studies: Secondary | ICD-10-CM | POA: Diagnosis not present

## 2018-11-20 DIAGNOSIS — R1011 Right upper quadrant pain: Secondary | ICD-10-CM | POA: Diagnosis present

## 2018-11-20 DIAGNOSIS — R748 Abnormal levels of other serum enzymes: Secondary | ICD-10-CM

## 2018-11-20 DIAGNOSIS — I340 Nonrheumatic mitral (valve) insufficiency: Secondary | ICD-10-CM | POA: Diagnosis not present

## 2018-11-20 DIAGNOSIS — I361 Nonrheumatic tricuspid (valve) insufficiency: Secondary | ICD-10-CM | POA: Diagnosis not present

## 2018-11-20 DIAGNOSIS — K811 Chronic cholecystitis: Secondary | ICD-10-CM | POA: Diagnosis present

## 2018-11-20 DIAGNOSIS — J9601 Acute respiratory failure with hypoxia: Secondary | ICD-10-CM | POA: Diagnosis not present

## 2018-11-20 DIAGNOSIS — Z7982 Long term (current) use of aspirin: Secondary | ICD-10-CM | POA: Diagnosis not present

## 2018-11-20 DIAGNOSIS — K851 Biliary acute pancreatitis without necrosis or infection: Principal | ICD-10-CM

## 2018-11-20 DIAGNOSIS — E872 Acidosis: Secondary | ICD-10-CM | POA: Diagnosis present

## 2018-11-20 DIAGNOSIS — J90 Pleural effusion, not elsewhere classified: Secondary | ICD-10-CM | POA: Diagnosis present

## 2018-11-20 LAB — HEPATIC FUNCTION PANEL
ALK PHOS: 69 U/L (ref 38–126)
ALT: 137 U/L — ABNORMAL HIGH (ref 0–44)
AST: 128 U/L — ABNORMAL HIGH (ref 15–41)
Albumin: 3.4 g/dL — ABNORMAL LOW (ref 3.5–5.0)
Bilirubin, Direct: 0.2 mg/dL (ref 0.0–0.2)
Indirect Bilirubin: 0.7 mg/dL (ref 0.3–0.9)
TOTAL PROTEIN: 6 g/dL — AB (ref 6.5–8.1)
Total Bilirubin: 0.9 mg/dL (ref 0.3–1.2)

## 2018-11-20 LAB — HIV ANTIBODY (ROUTINE TESTING W REFLEX): HIV Screen 4th Generation wRfx: NONREACTIVE

## 2018-11-20 LAB — LIPASE, BLOOD: Lipase: 1007 U/L — ABNORMAL HIGH (ref 11–51)

## 2018-11-20 MED ORDER — GADOBUTROL 1 MMOL/ML IV SOLN
7.5000 mL | Freq: Once | INTRAVENOUS | Status: AC | PRN
  Administered 2018-11-20: 7.5 mL via INTRAVENOUS

## 2018-11-20 MED ORDER — SODIUM CHLORIDE 0.9 % IV SOLN
INTRAVENOUS | Status: DC
  Administered 2018-11-20: 15:00:00 via INTRAVENOUS

## 2018-11-20 MED ORDER — LACTATED RINGERS IV SOLN
INTRAVENOUS | Status: DC
  Administered 2018-11-20 – 2018-11-23 (×9): via INTRAVENOUS

## 2018-11-20 NOTE — Progress Notes (Signed)
Report called to Aurea Graff, RN on 6N. Patient transferred to room 6N30.

## 2018-11-20 NOTE — H&P (Signed)
History and Physical    Joy Mosley ZOX:[id number] DOB: 04/12/1951 DOA: 11/19/2018  PCP: Sheliah Hatchabori, Katherine E, MD Patient coming from: Home  Chief Complaint: Abdominal pain  HPI: Joy Mosley is a 68 y.o. female with medical history significant of diverticulitis presenting to the hospital for evaluation of abdominal pain.  Patient states she ate lunch at noon 1/29 and then took a nap.  She woke up from her nap within 30 minutes with acute onset severe epigastric pain radiating to her back.  She describes the pain as squeezing in character.  Also reports having associated nausea and vomiting.  Denies having any fevers or chills.  Denies having any chest pain.  States she drinks alcohol only socially; had an [place] coffee a week ago.  Review of Systems: As per HPI otherwise 10 point review of systems negative.  Past Medical History:  Diagnosis Date  . Diverticulitis     Past Surgical History:  Procedure Laterality Date  . BREAST CYST ASPIRATION    . OVARIAN CYST REMOVAL  1980  . TONSILLECTOMY AND ADENOIDECTOMY       reports that she has never smoked. She has never used smokeless tobacco. She reports that she does not drink alcohol or use drugs.  No Known Allergies  Family History  Problem Relation Age of Onset  . Leukemia Mother   . Cancer Father        prostate  . Cancer Maternal Grandfather   . Breast cancer Neg Hx     Prior to Admission medications   Medication Sig Start Date End Date Taking? Authorizing Provider  aspirin-acetaminophen-caffeine (EXCEDRIN MIGRAINE) [id number] MG tablet Take 1 tablet by mouth every 6 (six) hours as needed for headache.   Yes [provider]    Physical Exam: Vitals:   11/19/18 1945 11/19/18 2030 11/19/18 2130 11/19/18 2203  BP: 112/66 103/75 111/70 121/71  Pulse: 72 70 81 68  Resp: 13 15 15 18   Temp:    97.9 F (36.6 C)  TempSrc:    Oral  SpO2: 98% 95% 99% 98%  Weight:    77.1 kg  Height:    5\' 4"  (1.626 m)     Physical Exam  Constitutional: She is oriented to person, place, and time. She appears well-developed and well-nourished. No distress.  HENT:  Head: Normocephalic.  Mouth/Throat: Oropharynx is clear and moist.  Eyes: Right eye exhibits no discharge. Left eye exhibits no discharge.  Neck: Neck supple.  Cardiovascular: Normal rate, regular rhythm and intact distal pulses.  Pulmonary/Chest: Effort normal and breath sounds normal. No respiratory distress. She has no wheezes. She has no rales.  Abdominal: Soft. Bowel sounds are normal. She exhibits no distension. There is abdominal tenderness. There is guarding. There is no rebound.  Epigastrium tender to palpation  Musculoskeletal:        General: No edema.  Neurological: She is alert and oriented to person, place, and time.  Skin: Skin is warm and dry. She is not diaphoretic.  Psychiatric: She has a normal mood and affect. Her behavior is normal.     Labs on Admission: I have personally reviewed following labs and imaging studies  CBC: Recent Labs  Lab 11/19/18 1500  WBC 7.3  HGB 13.4  HCT 41.4  MCV 91.2  PLT 273   Basic Metabolic Panel: Recent Labs  Lab 11/19/18 1500  NA 138  K 4.1  CL 105  CO2 24  GLUCOSE 127*  BUN 17  CREATININE 0.67  CALCIUM 8.7*   GFR: Estimated Creatinine Clearance: 66.7 mL/min (by C-G formula based on SCr of 0.67 mg/dL). Liver Function Tests: Recent Labs  Lab 11/19/18 1500  AST 112*  ALT 56*  ALKPHOS 71  BILITOT 0.7  PROT 6.5  ALBUMIN 3.6   Recent Labs  Lab 11/19/18 1500  LIPASE 3,377*   No results for input(s): AMMONIA in the last 168 hours. Coagulation Profile: No results for input(s): INR, PROTIME in the last 168 hours. Cardiac Enzymes: No results for input(s): CKTOTAL, CKMB, CKMBINDEX, TROPONINI in the last 168 hours. BNP (last 3 results) No results for input(s): PROBNP in the last 8760 hours. HbA1C: No results for input(s): HGBA1C in the last 72 hours. CBG: No  results for input(s): GLUCAP in the last 168 hours. Lipid Profile: Recent Labs    11/19/18 1915  CHOL 185  HDL 48  LDLCALC 107*  TRIG 148  CHOLHDL 3.9   Thyroid Function Tests: No results for input(s): TSH, T4TOTAL, FREET4, T3FREE, THYROIDAB in the last 72 hours. Anemia Panel: No results for input(s): VITAMINB12, FOLATE, FERRITIN, TIBC, IRON, RETICCTPCT in the last 72 hours. Urine analysis:    Component Value Date/Time   COLORURINE YELLOW 11/19/2018 1836   APPEARANCEUR CLEAR 11/19/2018 1836   LABSPEC 1.015 11/19/2018 1836   PHURINE 5.0 11/19/2018 1836   GLUCOSEU NEGATIVE 11/19/2018 1836   HGBUR NEGATIVE 11/19/2018 1836   BILIRUBINUR NEGATIVE 11/19/2018 1836   KETONESUR NEGATIVE 11/19/2018 1836   PROTEINUR NEGATIVE 11/19/2018 1836   NITRITE NEGATIVE 11/19/2018 1836   LEUKOCYTESUR NEGATIVE 11/19/2018 1836    Radiological Exams on Admission: Dg Chest 2 View  Result Date: 11/19/2018 CLINICAL DATA:  Bilateral breast pain EXAM: CHEST - 2 VIEW COMPARISON:  08/08/2014 FINDINGS: Cardiac shadow is within normal limits. Lungs are well aerated bilaterally. No focal infiltrate or sizable effusion is seen. No bony abnormality is noted. IMPRESSION: No acute abnormality noted. Electronically Signed   By: Alcide Clever M.D.   On: 11/19/2018 15:49   US Abdomen Limited Ruq  Result Date: 11/19/2018 CLINICAL DATA:  Right upper quadrant pain EXAM: ULTRASOUND ABDOMEN LIMITED RIGHT UPPER QUADRANT COMPARISON:  None. FINDINGS: Gallbladder: Numerous small echogenic shadowing gallstones. Normal wall thickness measuring 2 mm. No Murphy's sign. Gallstones measure 5 mm or less in size. Common bile duct: Diameter: 3.3 mm Liver: Mild heterogeneous echotexture without focal abnormality. No biliary dilatation. No surrounding free fluid or ascites. Portal vein is patent on color Doppler imaging with normal direction of blood flow towards the liver. IMPRESSION: Cholelithiasis.  No other acute finding by ultrasound.  Electronically Signed   By: Judie Petit.  Shick M.D.   On: 11/19/2018 20:10    EKG: Independently reviewed.  Normal sinus rhythm (heart rate 80).  Assessment/Plan Active Problems:   Acute pancreatitis   Suspected acute gallstone pancreatitis -Afebrile and hemodynamically stable. -No leukocytosis.  Lipase 3377.  AST 112, ALT 56.  Remainder of LFTs normal. -Triglycerides normal.  No history of alcohol abuse. -Right upper quadrant ultrasound with evidence of cholelithiasis.  No biliary dilatation or evidence of choledocholithiasis. -ED provider spoke to Dr. Loreta Ave from GI who recommended getting an MRCP.  GI will see the patient in the morning. -MRCP -Keep n.p.o. at this time -IV fluid -IV Zofran PRN -IV Dilaudid 0.5 mg every 3 hours as needed for pain -Repeat hepatic function panel and lipase in the morning  DVT prophylaxis: Lovenox Code Status: Patient wishes to be full code. Family Communication: No family available. Disposition Plan: Anticipate discharge after  clinical improvement. Consults called: GI Admission status: Observation   John Giovanni MD [facility] Pager [phone]  If 7PM-7AM, please contact night-coverage [email or web address] Password [email or web address]  11/20/2018, 2:49 AM

## 2018-11-20 NOTE — Consult Note (Signed)
Reason for Consult: Gallstone pancreatitis Referring Physician: [facility]  Joy Mosley HPI: This is a 68 year old female without any significant PMH admitted for a gallstone pancreatitis.  [facility] with an epigastric tightness and radiation to her back.  The pain started after PO intake and it progressively worsened.  She presented to the ER and she was noted to have a lipase at 3,377.  A RUQ U/S was positive for cholelithiasis, but no cholecystitis.  Her CBD was measured to be at 3.3 mm and the MRCP was negative for choledocholithiasis.  Her liver enzymes were elevated consistent with the gallstone pancreatitis.  She is feeling better, but there is still some soreness in her abdomen.  Past Medical History:  Diagnosis Date  . Diverticulitis     Past Surgical History:  Procedure Laterality Date  . BREAST CYST ASPIRATION    . OVARIAN CYST REMOVAL  1980  . TONSILLECTOMY AND ADENOIDECTOMY      Family History  Problem Relation Age of Onset  . Leukemia Mother   . Cancer Father        prostate  . Cancer Maternal Grandfather   . Breast cancer Neg Hx     Social History:  reports that she has never smoked. She has never used smokeless tobacco. She reports that she does not drink alcohol or use drugs.  Allergies: No Known Allergies  Medications:  Scheduled: . enoxaparin (LOVENOX) injection  40 mg Subcutaneous Q24H  . sodium chloride flush  3 mL Intravenous Once   Continuous: . sodium chloride 200 mL/hr at 11/20/18 1451    Results for orders placed or performed during the hospital encounter of 11/19/18 (from the past 24 hour(s))  Urinalysis, Routine w reflex microscopic     Status: None   Collection Time: 11/19/18  6:36 PM  Result Value Ref Range   Color, Urine YELLOW YELLOW   APPearance CLEAR CLEAR   Specific Gravity, Urine 1.015 1.005 - 1.030   pH 5.0 5.0 - 8.0   Glucose, UA NEGATIVE NEGATIVE mg/dL   Hgb urine dipstick NEGATIVE NEGATIVE    Bilirubin Urine NEGATIVE NEGATIVE   Ketones, ur NEGATIVE NEGATIVE mg/dL   Protein, ur NEGATIVE NEGATIVE mg/dL   Nitrite NEGATIVE NEGATIVE   Leukocytes, UA NEGATIVE NEGATIVE  Lipid panel     Status: Abnormal   Collection Time: 11/19/18  7:15 PM  Result Value Ref Range   Cholesterol 185 0 - 200 mg/dL   Triglycerides 161148 <096<150 mg/dL   HDL 48 >04>40 mg/dL   Total CHOL/HDL Ratio 3.9 RATIO   VLDL 30 0 - 40 mg/dL   LDL Cholesterol 540107 (H) 0 - 99 mg/dL  HIV antibody (Routine Testing)     Status: None   Collection Time: 11/20/18  5:58 AM  Result Value Ref Range   HIV Screen 4th Generation wRfx Non Reactive Non Reactive  Hepatic function panel     Status: Abnormal   Collection Time: 11/20/18  5:58 AM  Result Value Ref Range   Total Protein 6.0 (L) 6.5 - 8.1 g/dL   Albumin 3.4 (L) 3.5 - 5.0 g/dL   AST 981128 (H) 15 - 41 U/L   ALT 137 (H) 0 - 44 U/L   Alkaline Phosphatase 69 38 - 126 U/L   Total Bilirubin 0.9 0.3 - 1.2 mg/dL   Bilirubin, Direct 0.2 0.0 - 0.2 mg/dL   Indirect Bilirubin 0.7 0.3 - 0.9 mg/dL  Lipase, blood  Status: Abnormal   Collection Time: 11/20/18  5:58 AM  Result Value Ref Range   Lipase 1,007 (H) 11 - 51 U/L     Dg Chest 2 View  Result Date: 11/19/2018 CLINICAL DATA:  Bilateral breast pain EXAM: CHEST - 2 VIEW COMPARISON:  08/08/2014 FINDINGS: Cardiac shadow is within normal limits. Lungs are well aerated bilaterally. No focal infiltrate or sizable effusion is seen. No bony abnormality is noted. IMPRESSION: No acute abnormality noted. Electronically Signed   By: Alcide CleverMark  Lukens M.D.   On: 11/19/2018 15:49   Mr Abdomen Mrcp Vivien RossettiW Wo Contast  Result Date: 11/20/2018 CLINICAL DATA:  Abdominal pain. EXAM: MRI ABDOMEN WITHOUT AND WITH CONTRAST (INCLUDING MRCP) TECHNIQUE: Multiplanar multisequence MR imaging of the abdomen was performed both before and after the administration of intravenous contrast. Heavily T2-weighted images of the biliary and pancreatic ducts were obtained, and  three-dimensional MRCP images were rendered by post processing. CONTRAST:  7.5 cc Gadavist COMPARISON:  Ultrasound 11/19/2018 FINDINGS: Lower chest: Small pleural effusions noted right greater than left. Hepatobiliary: No focal liver abnormality. The gallbladder appears within normal limits. No gallstones or gallbladder sludge. No common bile duct dilatation. No evidence for choledocholithiasis. Pancreas: There is diffuse the edema of the pancreas with surrounding soft tissue stranding and free fluid compatible with acute pancreatitis. No pancreatic necrosis main duct dilatation or mass. Spleen:  Within normal limits in size and appearance. Adrenals/Urinary Tract: Normal appearance of the adrenal glands. The kidneys are unremarkable. No mass or hydronephrosis visualized. Stomach/Bowel: Stomach nondistended. Diverticulum identified arising from the transverse portion of the duodenum. No abnormal dilatation of the small or large bowel loops. Vascular/Lymphatic: Normal appearance of the abdominal aorta. The portal vein and hepatic veins appear patent. Other: There is a moderate to large volume of free fluid identified within the abdomen. Musculoskeletal: No suspicious bone lesions identified. IMPRESSION: 1. Imaging findings compatible with acute pancreatitis. No pancreatic necrosis or pseudocyst identified at this time. 2. No gallstones, biliary ductal dilatation or choledocholithiasis. Electronically Signed   By: Signa Kellaylor  Stroud M.D.   On: 11/20/2018 07:29   [place] Abdomen Limited Ruq  Result Date: 11/19/2018 CLINICAL DATA:  Right upper quadrant pain EXAM: ULTRASOUND ABDOMEN LIMITED RIGHT UPPER QUADRANT COMPARISON:  None. FINDINGS: Gallbladder: Numerous small echogenic shadowing gallstones. Normal wall thickness measuring 2 mm. No Murphy's sign. Gallstones measure 5 mm or less in size. Common bile duct: Diameter: 3.3 mm Liver: Mild heterogeneous echotexture without focal abnormality. No biliary dilatation. No  surrounding free fluid or ascites. Portal vein is patent on color Doppler imaging with normal direction of blood flow towards the liver. IMPRESSION: Cholelithiasis.  No other acute finding by ultrasound. Electronically Signed   By: Judie PetitM.  Shick M.D.   On: 11/19/2018 20:10    ROS:  As stated above in the HPI otherwise negative.  Blood pressure 122/68, pulse 76, temperature 98.4 F (36.9 C), temperature source Oral, resp. rate 18, height 5\' 4"  (1.626 m), weight 77.1 kg, SpO2 95 %.    PE: Gen: NAD, Alert and Oriented HEENT:  [place]/AT, EOMI Neck: Supple, no LAD Lungs: CTA Bilaterally CV: RRR without M/G/R ABM: Soft, NTND, +BS Ext: No C/C/E  Assessment/Plan: 1) Gallstone pancreatitis. 2) Cholelithiasis. 3) Elevated liver enzymes.   She is improving and she is receiving adequate IV hydration with NS, but she will benefit with LR.  She does not have any CBD stones.  Plan: 1) Change to LR 200 ml per hour. 2) NPO for now. 3) Surgical consultation  for lap chole. 4) Signing off.  Joy Mosley D 11/20/2018, 5:05 PM

## 2018-11-21 ENCOUNTER — Inpatient Hospital Stay (HOSPITAL_COMMUNITY): Payer: Medicare Other

## 2018-11-21 LAB — COMPREHENSIVE METABOLIC PANEL
ALT: 81 U/L — ABNORMAL HIGH (ref 0–44)
AST: 47 U/L — ABNORMAL HIGH (ref 15–41)
Albumin: 3 g/dL — ABNORMAL LOW (ref 3.5–5.0)
Alkaline Phosphatase: 58 U/L (ref 38–126)
Anion gap: 7 (ref 5–15)
BUN: 7 mg/dL — ABNORMAL LOW (ref 8–23)
CO2: 22 mmol/L (ref 22–32)
Calcium: 8 mg/dL — ABNORMAL LOW (ref 8.9–10.3)
Chloride: 113 mmol/L — ABNORMAL HIGH (ref 98–111)
Creatinine, Ser: 0.74 mg/dL (ref 0.44–1.00)
GFR calc Af Amer: >60 mL/min (ref >60)
GFR calc non Af Amer: >60 mL/min (ref >60)
Glucose, Bld: 96 mg/dL (ref 70–99)
Potassium: 3.7 mmol/L (ref 3.5–5.1)
Sodium: 142 mmol/L (ref 135–145)
Total Bilirubin: 1.1 mg/dL (ref 0.3–1.2)
Total Protein: 5.4 g/dL — ABNORMAL LOW (ref 6.5–8.1)

## 2018-11-21 NOTE — Plan of Care (Signed)
  Problem: Education: Goal: Knowledge of Pancreatitis treatment and prevention will improve Outcome: Progressing   Problem: Health Behavior/Discharge Planning: Goal: Ability to formulate a plan to maintain an alcohol-free life will improve Outcome: Progressing   Problem: Nutritional: Goal: Ability to achieve adequate nutritional intake will improve Outcome: Progressing   Problem: Clinical Measurements: Goal: Complications related to the disease process, condition or treatment will be avoided or minimized Outcome: Progressing   Problem: Education: Goal: Knowledge of General Education information will improve Description Including pain rating scale, medication(s)/side effects and non-pharmacologic comfort measures Outcome: Progressing

## 2018-11-21 NOTE — Progress Notes (Signed)
Patient was admitted after midnight see full H&P for details.  Patient's triglycerides not significantly elevated, not on any significant medications, and only reports drinking 1 glass of wine per week. Ultrasound did reveal cholelithiasis with numerous stones less than 5 mm. MRCP did not show any acute signs of obstruction. Liver enzymes trending downward.  Still has tenderness to palpation epigastrically on physical exam but appears comfortable.  Suspect gallstone pancreatitis as likely cause of symptoms. Continue with IV fluids, pain medications, and advance patient's diet as tolerated.  Awaiting gastroenterology recommendations.

## 2018-11-21 NOTE — Consult Note (Signed)
[facility] Consult [facility]  Joy Mosley 01/13/1951  [id number].    Requesting MD: Lowell GuitarPowell Chief Complaint/Reason for Consult: gallstone pancreatitis  HPI:  Patient is a 68 year old female who is otherwise healthy, [facility] with [facility]. Pain started afternoon on 1/29 and woke her up from a nap. Pain characterized as severe and in the epigastric region and radiating to back. Associated nausea and vomiting. Currently pain is somewhat improved from admission but still present. She reports she still has some nausea occasionally but has not vomited since yesterday. Passing flatus. Patient denies ever experiencing similar symptoms in the past. Patient denied fever, chills, chest pain, SOB, diarrhea or constipation, urinary symptoms. Patient does report cough with transitioning from laying to sitting. Patient drinks maybe once per week, has never smoked cigarettes or used smokeless tobacco and has never used illicit drugs. Patient is retired. NKDA. No blood thinning medications. Past abdominal surgery includes one surgery in 1980 for an ovarian cyst.   ROS: Review of Systems  Constitutional: Negative for chills and fever.  Respiratory: Negative for shortness of breath.   Cardiovascular: Negative for chest pain.  Gastrointestinal: Positive for abdominal pain, nausea and vomiting. Negative for blood in stool, constipation and diarrhea.  Genitourinary: Negative for dysuria, frequency and urgency.  All other systems reviewed and are negative.   Family History  Problem Relation Age of Onset  . Leukemia Mother   . Cancer Father        prostate  . Cancer Maternal Grandfather   . Breast cancer Neg Hx     Past Medical History:  Diagnosis Date  . Diverticulitis     Past Surgical History:  Procedure Laterality Date  . BREAST CYST ASPIRATION    . OVARIAN CYST REMOVAL  1980  . TONSILLECTOMY AND ADENOIDECTOMY      Social History:  reports that she has never  smoked. She has never used smokeless tobacco. She reports that she does not drink alcohol or use drugs.  Allergies: No Known Allergies  Medications Prior to Admission  Medication Sig Dispense Refill  . aspirin-acetaminophen-caffeine (EXCEDRIN MIGRAINE) 250-250-65 MG tablet Take 1 tablet by mouth every 6 (six) hours as needed for headache.      Blood pressure 117/63, pulse 85, temperature 99.4 F (37.4 C), temperature source Oral, resp. rate 18, height 5\' 4"  (1.626 m), weight 77.1 kg, SpO2 92 %. Physical Exam: Physical Exam Constitutional:      General: She is not in acute distress.    Appearance: She is well-developed. She is obese. She is not toxic-appearing.  HENT:     Head: Normocephalic and atraumatic.     Right Ear: External ear normal.     Left Ear: External ear normal.     Nose: Nose normal.     Mouth/Throat:     Lips: Pink.     Mouth: Mucous membranes are moist.  Eyes:     General: Lids are normal. No scleral icterus.    Extraocular Movements: Extraocular movements intact.     Conjunctiva/sclera: Conjunctivae normal.  Neck:     Musculoskeletal: Normal range of motion and neck supple.  Cardiovascular:     Rate and Rhythm: Normal rate and regular rhythm.     Pulses:          Radial pulses are 2+ on the right side and 2+ on the left side.       Dorsalis pedis pulses are 2+ on the right side and 2+  on the left side.  Pulmonary:     Effort: Pulmonary effort is normal.     Breath sounds: Normal breath sounds.  Abdominal:     General: Bowel sounds are normal. There is no distension.     Palpations: Abdomen is soft.     Tenderness: There is generalized abdominal tenderness and tenderness in the epigastric area. There is no guarding or rebound. Negative signs include Murphy's sign.     Hernia: No hernia is present.  Musculoskeletal:     Comments: ROM grossly intact in BL upper and lower extremities  Skin:    General: Skin is warm and dry.     Coloration: Skin is not  jaundiced.  Neurological:     Mental Status: She is alert and oriented to person, place, and time.  Psychiatric:        Attention and Perception: Attention and perception normal.        Mood and Affect: Mood and affect normal.        Behavior: Behavior is cooperative.     Results for orders placed or performed during the hospital encounter of 11/19/18 (from the past 48 hour(s))  Lipase, blood     Status: Abnormal   Collection Time: 11/19/18  3:00 PM  Result Value Ref Range   Lipase 3,377 (H) 11 - 51 U/L    Comment: RESULTS CONFIRMED BY MANUAL DILUTION Performed at [facility], [address] [place], [place] [address]   Comprehensive metabolic panel     Status: Abnormal   Collection Time: 11/19/18  3:00 PM  Result Value Ref Range   Sodium 138 135 - 145 mmol/L   Potassium 4.1 3.5 - 5.1 mmol/L   Chloride 105 98 - 111 mmol/L   CO2 24 22 - 32 mmol/L   Glucose, Bld 127 (H) 70 - 99 mg/dL   BUN 17 8 - 23 mg/dL   Creatinine, Ser 6.040.67 0.44 - 1.00 mg/dL   Calcium 8.7 (L) 8.9 - 10.3 mg/dL   Total Protein 6.5 6.5 - 8.1 g/dL   Albumin 3.6 3.5 - 5.0 g/dL   AST 540112 (H) 15 - 41 U/L   ALT 56 (H) 0 - 44 U/L   Alkaline Phosphatase 71 38 - 126 U/L   Total Bilirubin 0.7 0.3 - 1.2 mg/dL   GFR calc non Af Amer >60 >60 mL/min   GFR calc Af Amer >60 >60 mL/min   Anion gap 9 5 - 15    Comment: Performed at [facility], [address] [place], [place] [address]  CBC     Status: None   Collection Time: 11/19/18  3:00 PM  Result Value Ref Range   WBC 7.3 4.0 - 10.5 K/uL   RBC 4.54 3.87 - 5.11 MIL/uL   Hemoglobin 13.4 12.0 - 15.0 g/dL   HCT 14.741.4 82.936.0 - 56.246.0 %   MCV 91.2 80.0 - 100.0 fL   MCH 29.5 26.0 - 34.0 pg   MCHC 32.4 30.0 - 36.0 g/dL   RDW 13.013.2 86.511.5 - 78.415.5 %   Platelets 273 150 - 400 K/uL   nRBC 0.0 0.0 - 0.2 %    Comment: Performed at [facility], [address] [place], [place] [address]  I-Stat Troponin, ED (not at [facility])     Status: None   Collection Time:  11/19/18  3:11 PM  Result Value Ref Range   Troponin i, poc 0.00 0.00 - 0.08 ng/mL   Comment  3            Comment: Due to the release kinetics of cTnI, a negative result within the first hours of the onset of symptoms does not rule out myocardial infarction with certainty. If myocardial infarction is still suspected, repeat the test at appropriate intervals.   Urinalysis, Routine w reflex microscopic     Status: None   Collection Time: 11/19/18  6:36 PM  Result Value Ref Range   Color, Urine YELLOW YELLOW   APPearance CLEAR CLEAR   Specific Gravity, Urine 1.015 1.005 - 1.030   pH 5.0 5.0 - 8.0   Glucose, UA NEGATIVE NEGATIVE mg/dL   Hgb urine dipstick NEGATIVE NEGATIVE   Bilirubin Urine NEGATIVE NEGATIVE   Ketones, ur NEGATIVE NEGATIVE mg/dL   Protein, ur NEGATIVE NEGATIVE mg/dL   Nitrite NEGATIVE NEGATIVE   Leukocytes, UA NEGATIVE NEGATIVE    Comment: Performed at [facility], [address], [place], [place] [address]  Lipid panel     Status: Abnormal   Collection Time: 11/19/18  7:15 PM  Result Value Ref Range   Cholesterol 185 0 - 200 mg/dL   Triglycerides 789 <381 mg/dL   HDL 48 >01 mg/dL   Total CHOL/HDL Ratio 3.9 RATIO   VLDL 30 0 - 40 mg/dL   LDL Cholesterol 751 (H) 0 - 99 mg/dL    Comment:        Total Cholesterol/HDL:CHD Risk Coronary Heart Disease Risk Table                     Men   Women  1/2 Average Risk   3.4   3.3  Average Risk       5.0   4.4  2 X Average Risk   9.6   7.1  3 X Average Risk  23.4   11.0        Use the calculated Patient Ratio above and the CHD Risk Table to determine the patient's CHD Risk.        ATP III CLASSIFICATION (LDL):  <100     mg/dL   Optimal  025-852  mg/dL   Near or Above                    Optimal  130-159  mg/dL   Borderline  778-242  mg/dL   High  >353     mg/dL   Very High Performed at [facility], [address], [place], [place] [address]   HIV antibody (Routine Testing)     Status: None    Collection Time: 11/20/18  5:58 AM  Result Value Ref Range   HIV Screen 4th Generation wRfx Non Reactive Non Reactive    Comment: (NOTE) Performed At: [facility] [address] [place], [place] [id number] Jolene Schimke MD PP:[id number]   Hepatic function panel     Status: Abnormal   Collection Time: 11/20/18  5:58 AM  Result Value Ref Range   Total Protein 6.0 (L) 6.5 - 8.1 g/dL   Albumin 3.4 (L) 3.5 - 5.0 g/dL   AST 580 (H) 15 - 41 U/L   ALT 137 (H) 0 - 44 U/L   Alkaline Phosphatase 69 38 - 126 U/L   Total Bilirubin 0.9 0.3 - 1.2 mg/dL   Bilirubin, Direct 0.2 0.0 - 0.2 mg/dL   Indirect Bilirubin 0.7 0.3 - 0.9 mg/dL    Comment: Performed at [facility], [address] [place], [place] [address]  Lipase, blood     Status: Abnormal   Collection Time: 11/20/18  5:58 AM  Result Value Ref Range   Lipase 1,007 (H) 11 - 51 U/L    Comment: RESULTS CONFIRMED BY MANUAL DILUTION Performed at [facility], [address] [place], [place] [address]   Comprehensive metabolic panel     Status: Abnormal   Collection Time: 11/21/18  1:14 AM  Result Value Ref Range   Sodium 142 135 - 145 mmol/L   Potassium 3.7 3.5 - 5.1 mmol/L   Chloride 113 (H) 98 - 111 mmol/L   CO2 22 22 - 32 mmol/L   Glucose, Bld 96 70 - 99 mg/dL   BUN 7 (L) 8 - 23 mg/dL   Creatinine, Ser 5.63 0.44 - 1.00 mg/dL   Calcium 8.0 (L) 8.9 - 10.3 mg/dL   Total Protein 5.4 (L) 6.5 - 8.1 g/dL   Albumin 3.0 (L) 3.5 - 5.0 g/dL   AST 47 (H) 15 - 41 U/L   ALT 81 (H) 0 - 44 U/L   Alkaline Phosphatase 58 38 - 126 U/L   Total Bilirubin 1.1 0.3 - 1.2 mg/dL   GFR calc non Af Amer >60 >60 mL/min   GFR calc Af Amer >60 >60 mL/min   Anion gap 7 5 - 15    Comment: Performed at [facility], [address] [place], [place] [address]   Dg Chest 2 View  Result Date: 11/19/2018 CLINICAL DATA:  Bilateral breast pain EXAM: CHEST - 2 VIEW COMPARISON:  08/08/2014 FINDINGS: Cardiac shadow is within normal  limits. Lungs are well aerated bilaterally. No focal infiltrate or sizable effusion is seen. No bony abnormality is noted. IMPRESSION: No acute abnormality noted. Electronically Signed   By: Alcide Clever M.D.   On: 11/19/2018 15:49   Mr Abdomen Mrcp Vivien Rossetti Contast  Result Date: 11/20/2018 CLINICAL DATA:  Abdominal pain. EXAM: MRI ABDOMEN WITHOUT AND WITH CONTRAST (INCLUDING MRCP) TECHNIQUE: Multiplanar multisequence MR imaging of the abdomen was performed both before and after the administration of intravenous contrast. Heavily T2-weighted images of the biliary and pancreatic ducts were obtained, and three-dimensional MRCP images were rendered by post processing. CONTRAST:  7.5 cc Gadavist COMPARISON:  Ultrasound 11/19/2018 FINDINGS: Lower chest: Small pleural effusions noted right greater than left. Hepatobiliary: No focal liver abnormality. The gallbladder appears within normal limits. No gallstones or gallbladder sludge. No common bile duct dilatation. No evidence for choledocholithiasis. Pancreas: There is diffuse the edema of the pancreas with surrounding soft tissue stranding and free fluid compatible with acute pancreatitis. No pancreatic necrosis main duct dilatation or mass. Spleen:  Within normal limits in size and appearance. Adrenals/Urinary Tract: Normal appearance of the adrenal glands. The kidneys are unremarkable. No mass or hydronephrosis visualized. Stomach/Bowel: Stomach nondistended. Diverticulum identified arising from the transverse portion of the duodenum. No abnormal dilatation of the small or large bowel loops. Vascular/Lymphatic: Normal appearance of the abdominal aorta. The portal vein and hepatic veins appear patent. Other: There is a moderate to large volume of free fluid identified within the abdomen. Musculoskeletal: No suspicious bone lesions identified. IMPRESSION: 1. Imaging findings compatible with acute pancreatitis. No pancreatic necrosis or pseudocyst identified at this time.  2. No gallstones, biliary ductal dilatation or choledocholithiasis. Electronically Signed   By: Signa Kell M.D.   On: 11/20/2018 07:29   US Abdomen Limited Ruq  Result Date: 11/19/2018 CLINICAL DATA:  Right upper quadrant pain EXAM:  ULTRASOUND ABDOMEN LIMITED RIGHT UPPER QUADRANT COMPARISON:  None. FINDINGS: Gallbladder: Numerous small echogenic shadowing gallstones. Normal wall thickness measuring 2 mm. No Murphy's sign. Gallstones measure 5 mm or less in size. Common bile duct: Diameter: 3.3 mm Liver: Mild heterogeneous echotexture without focal abnormality. No biliary dilatation. No surrounding free fluid or ascites. Portal vein is patent on color Doppler imaging with normal direction of blood flow towards the liver. IMPRESSION: Cholelithiasis.  No other acute finding by ultrasound. Electronically Signed   By: Judie Petit.  Shick M.D.   On: 11/19/2018 20:10      Assessment/Plan Cough - patient reports cough when transitioning from lying to sitting, CXR and IS; defer further workup to primary service  Gallstone pancreatitis - US showed cholelithiasis, MRCP showed pancreatitis but no choledocholithiasis or cholelithiasis - lipase >3000 on admission, 1000 yesterday - repeat with AM labs tomorrow - Tbili 1.1, LFTs only mildly elevated - patient not currently ready for OR given elevated lipase and still having significant pain - we will follow for surgical readiness  FEN: CLD, IVF @ 150 cc/hr VTE: SCDs, ok for chemical VTE  ID: no abx indicated  Wells Guiles, [facility] 11/21/2018, 9:58 AM Pager: [phone] Consults: [phone] Mon-Fri 7:00 am-4:30 pm Sat-Sun 7:00 am-11:30 am

## 2018-11-21 NOTE — Progress Notes (Addendum)
PROGRESS NOTE    [name]  XOV:[id number] DOB: July 29, 1951 DOA: 11/19/2018 PCP: Sheliah Hatch, MD   Brief Narrative:  Joy Mosley is Joy Mosley 68 y.o. female with medical history significant of diverticulitis presenting to the hospital for evaluation of abdominal pain.  Patient states she ate lunch at noon 1/29 and then took Josafat Enrico nap.  She woke up from her nap within 30 minutes with acute onset severe epigastric pain radiating to her back.  She describes the pain as squeezing in character.  Also reports having associated nausea and vomiting.  Denies having any fevers or chills.  Denies having any chest pain.  States she drinks alcohol only socially; had an [place] coffee Sophonie Goforth week ago.   Assessment & Plan:   Active Problems:   Acute pancreatitis  Acute Pancreatitis: No meds, rare etoh, normal triglycerides. Gallstone pancreatitis suspected given [place] (interestingly, MRI did not show gallstones, discussed this with radiology this AM who noted gallstones on [place], but did note that it was unusual MRI was not showing stones). Continue NPO for now Continue LR for now at 150 cc/hr Dilaudid, zofran for pain/nausea Strict I/O, daily weights GI c/s, appreciate recs Will consult surgery  Elevated LFT's: suspect 2/2 above, follow, improving  DVT prophylaxis: lovenox Code Status: full Family Communication: none at bedside Disposition Plan: continue inpatient given persistent abdominal pain related to pancreatitis, need for surgical eval  Consultants:   GI  Surgery  Procedures:   none  Antimicrobials:  Anti-infectives (From admission, onward)   None         Subjective: C/o persistent abdominal pain Little appetite  Denies etoh.  Doesn't take meds  Objective: Vitals:   11/20/18 0757 11/20/18 1532 11/20/18 2127 11/21/18 0527  BP: 118/73 122/68 119/66 117/63  Pulse: 79 76 81 85  Resp: 18 18 16 18   Temp: 98.4 F (36.9 C) 98.4 F (36.9 C) 98.8 F (37.1 C) 99.4 F (37.4 C)    TempSrc: Oral Oral Oral Oral  SpO2: 97% 95% 100% 92%  Weight:  77.1 kg    Height:  5\' 4"  (1.626 m)      Intake/Output Summary (Last 24 hours) at 11/21/2018 0806 Last data filed at 11/21/2018 0109 Gross per 24 hour  Intake 2916.76 ml  Output 150 ml  Net 2766.76 ml   Filed Weights   11/19/18 1550 11/19/18 2203 11/20/18 1532  Weight: 74.8 kg 77.1 kg 77.1 kg    Examination:  General exam: Appears calm and comfortable  Respiratory system: Clear to auscultation. Respiratory effort normal. Cardiovascular system: S1 & S2 heard, RRR.  Gastrointestinal system: Abdomen is tender to palpation most in epigastric and LUQ. Central nervous system: Alert and oriented. No focal neurological deficits. Extremities: Symmetric 5 x 5 power. Skin: No rashes, lesions or ulcers Psychiatry: Judgement and insight appear normal. Mood & affect appropriate.     Data Reviewed: I have personally reviewed following labs and imaging studies  CBC: Recent Labs  Lab 11/19/18 1500  WBC 7.3  HGB 13.4  HCT 41.4  MCV 91.2  PLT 273   Basic Metabolic Panel: Recent Labs  Lab 11/19/18 1500 11/21/18 0114  NA 138 142  K 4.1 3.7  CL 105 113*  CO2 24 22  GLUCOSE 127* 96  BUN 17 7*  CREATININE 0.67 0.74  CALCIUM 8.7* 8.0*   GFR: Estimated Creatinine Clearance: 66.7 mL/min (by C-G formula based on SCr of 0.74 mg/dL). Liver Function Tests: Recent Labs  Lab 11/19/18 1500 11/20/18  [id number] 11/21/18 0114  AST 112* 128* 47*  ALT 56* 137* 81*  ALKPHOS 71 69 58  BILITOT 0.7 0.9 1.1  PROT 6.5 6.0* 5.4*  ALBUMIN 3.6 3.4* 3.0*   Recent Labs  Lab 11/19/18 1500 11/20/18 0558  LIPASE 3,377* 1,007*   No results for input(s): AMMONIA in the last 168 hours. Coagulation Profile: No results for input(s): INR, PROTIME in the last 168 hours. Cardiac Enzymes: No results for input(s): CKTOTAL, CKMB, CKMBINDEX, TROPONINI in the last 168 hours. BNP (last 3 results) No results for input(s): PROBNP in the last  8760 hours. HbA1C: No results for input(s): HGBA1C in the last 72 hours. CBG: No results for input(s): GLUCAP in the last 168 hours. Lipid Profile: Recent Labs    11/19/18 1915  CHOL 185  HDL 48  LDLCALC 107*  TRIG 148  CHOLHDL 3.9   Thyroid Function Tests: No results for input(s): TSH, T4TOTAL, FREET4, T3FREE, THYROIDAB in the last 72 hours. Anemia Panel: No results for input(s): VITAMINB12, FOLATE, FERRITIN, TIBC, IRON, RETICCTPCT in the last 72 hours. Sepsis Labs: No results for input(s): PROCALCITON, LATICACIDVEN in the last 168 hours.  No results found for this or any previous visit (from the past 240 hour(s)).       Radiology Studies: Dg Chest 2 View  Result Date: 11/19/2018 CLINICAL DATA:  Bilateral breast pain EXAM: CHEST - 2 VIEW COMPARISON:  08/08/2014 FINDINGS: Cardiac shadow is within normal limits. Lungs are well aerated bilaterally. No focal infiltrate or sizable effusion is seen. No bony abnormality is noted. IMPRESSION: No acute abnormality noted. Electronically Signed   By: Alcide CleverMark  Lukens M.D.   On: 11/19/2018 15:49   Mr Abdomen Mrcp Vivien RossettiW Wo Contast  Result Date: 11/20/2018 CLINICAL DATA:  Abdominal pain. EXAM: MRI ABDOMEN WITHOUT AND WITH CONTRAST (INCLUDING MRCP) TECHNIQUE: Multiplanar multisequence MR imaging of the abdomen was performed both before and after the administration of intravenous contrast. Heavily T2-weighted images of the biliary and pancreatic ducts were obtained, and three-dimensional MRCP images were rendered by post processing. CONTRAST:  7.5 cc Gadavist COMPARISON:  Ultrasound 11/19/2018 FINDINGS: Lower chest: Small pleural effusions noted right greater than left. Hepatobiliary: No focal liver abnormality. The gallbladder appears within normal limits. No gallstones or gallbladder sludge. No common bile duct dilatation. No evidence for choledocholithiasis. Pancreas: There is diffuse the edema of the pancreas with surrounding soft tissue stranding  and free fluid compatible with acute pancreatitis. No pancreatic necrosis main duct dilatation or mass. Spleen:  Within normal limits in size and appearance. Adrenals/Urinary Tract: Normal appearance of the adrenal glands. The kidneys are unremarkable. No mass or hydronephrosis visualized. Stomach/Bowel: Stomach nondistended. Diverticulum identified arising from the transverse portion of the duodenum. No abnormal dilatation of the small or large bowel loops. Vascular/Lymphatic: Normal appearance of the abdominal aorta. The portal vein and hepatic veins appear patent. Other: There is Britney Captain moderate to large volume of free fluid identified within the abdomen. Musculoskeletal: No suspicious bone lesions identified. IMPRESSION: 1. Imaging findings compatible with acute pancreatitis. No pancreatic necrosis or pseudocyst identified at this time. 2. No gallstones, biliary ductal dilatation or choledocholithiasis. Electronically Signed   By: Signa Kellaylor  Stroud M.D.   On: 11/20/2018 07:29   [place] Abdomen Limited Ruq  Result Date: 11/19/2018 CLINICAL DATA:  Right upper quadrant pain EXAM: ULTRASOUND ABDOMEN LIMITED RIGHT UPPER QUADRANT COMPARISON:  None. FINDINGS: Gallbladder: Numerous small echogenic shadowing gallstones. Normal wall thickness measuring 2 mm. No Murphy's sign. Gallstones measure 5 mm or less in  size. Common bile duct: Diameter: 3.3 mm Liver: Mild heterogeneous echotexture without focal abnormality. No biliary dilatation. No surrounding free fluid or ascites. Portal vein is patent on color Doppler imaging with normal direction of blood flow towards the liver. IMPRESSION: Cholelithiasis.  No other acute finding by ultrasound. Electronically Signed   By: Judie Petit.  Shick M.D.   On: 11/19/2018 20:10        Scheduled Meds: . enoxaparin (LOVENOX) injection  40 mg Subcutaneous Q24H  . sodium chloride flush  3 mL Intravenous Once   Continuous Infusions: . lactated ringers 200 mL/hr at 11/21/18 0454     LOS: 1 day     Time spent: over 30 min    Lacretia Nicks, MD [facility] Pager AMION  If 7PM-7AM, please contact night-coverage [email or web address] Password [email or web address] 11/21/2018, 8:06 AM

## 2018-11-22 LAB — COMPREHENSIVE METABOLIC PANEL WITH GFR
ALT: 58 U/L — ABNORMAL HIGH (ref 0–44)
AST: 40 U/L (ref 15–41)
Albumin: 2.9 g/dL — ABNORMAL LOW (ref 3.5–5.0)
Alkaline Phosphatase: 67 U/L (ref 38–126)
Anion gap: 12 (ref 5–15)
BUN: 5 mg/dL — ABNORMAL LOW (ref 8–23)
CO2: 19 mmol/L — ABNORMAL LOW (ref 22–32)
Calcium: 8.1 mg/dL — ABNORMAL LOW (ref 8.9–10.3)
Chloride: 106 mmol/L (ref 98–111)
Creatinine, Ser: 0.61 mg/dL (ref 0.44–1.00)
GFR calc Af Amer: >60 mL/min
GFR calc non Af Amer: >60 mL/min
Glucose, Bld: 92 mg/dL (ref 70–99)
Potassium: 3.6 mmol/L (ref 3.5–5.1)
Sodium: 137 mmol/L (ref 135–145)
Total Bilirubin: 1.8 mg/dL — ABNORMAL HIGH (ref 0.3–1.2)
Total Protein: 5.6 g/dL — ABNORMAL LOW (ref 6.5–8.1)

## 2018-11-22 LAB — CBC
HCT: 36.9 % (ref 36.0–46.0)
Hemoglobin: 12.1 g/dL (ref 12.0–15.0)
MCH: 29.5 pg (ref 26.0–34.0)
MCHC: 32.8 g/dL (ref 30.0–36.0)
MCV: 90 fL (ref 80.0–100.0)
Platelets: 239 10*3/uL (ref 150–400)
RBC: 4.1 MIL/uL (ref 3.87–5.11)
RDW: 13.2 % (ref 11.5–15.5)
WBC: 14.3 10*3/uL — ABNORMAL HIGH (ref 4.0–10.5)
nRBC: 0 % (ref 0.0–0.2)

## 2018-11-22 LAB — MAGNESIUM: Magnesium: 1.7 mg/dL (ref 1.7–2.4)

## 2018-11-22 LAB — LIPASE, BLOOD: Lipase: 40 U/L (ref 11–51)

## 2018-11-22 MED ORDER — KETOROLAC TROMETHAMINE 30 MG/ML IJ SOLN
30.0000 mg | Freq: Once | INTRAMUSCULAR | Status: AC
  Administered 2018-11-22: 30 mg via INTRAVENOUS
  Filled 2018-11-22: qty 1

## 2018-11-22 MED ORDER — IPRATROPIUM-ALBUTEROL 0.5-2.5 (3) MG/3ML IN SOLN
3.0000 mL | RESPIRATORY_TRACT | Status: DC | PRN
  Filled 2018-11-22: qty 3

## 2018-11-22 NOTE — Plan of Care (Signed)
  Problem: Clinical Measurements: Goal: Complications related to the disease process, condition or treatment will be avoided or minimized Outcome: Progressing   Problem: Activity: Goal: Risk for activity intolerance will decrease Outcome: Progressing

## 2018-11-22 NOTE — Progress Notes (Signed)
Patient ID: Joy Mosley, female   DOB: 03/13/1951, 68 y.o.   MRN: [id number] [facility] Progress Note:   * No surgery found *  Subjective: Mental status is clear;  She is still hurting in the midabdomen Objective: Vital signs in last 24 hours: Temp:  [98.9 F (37.2 C)-99.5 F (37.5 C)] 99.1 F (37.3 C) (02/01 0527) Pulse Rate:  [85-92] 89 (02/01 0527) Resp:  [17-18] 18 (02/01 0527) BP: (122-135)/(67-70) 133/70 (02/01 0527) SpO2:  [93 %-94 %] 93 % (02/01 0527) Weight:  [86.1 kg] 86.1 kg (02/01 0527)  Intake/Output from previous day: 01/31 0701 - 02/01 0700 In: 5103.8 [P.O.:600; I.V.:4503.8] Out: 200 [Urine:200] Intake/Output this shift: No intake/output data recorded.  Physical Exam: Work of breathing is not labored;  midepigastric deep pain consistent with pancreatitis  Lab Results:  Results for orders placed or performed during the hospital encounter of 11/19/18 (from the past 48 hour(s))  Comprehensive metabolic panel     Status: Abnormal   Collection Time: 11/21/18  1:14 AM  Result Value Ref Range   Sodium 142 135 - 145 mmol/L   Potassium 3.7 3.5 - 5.1 mmol/L   Chloride 113 (H) 98 - 111 mmol/L   CO2 22 22 - 32 mmol/L   Glucose, Bld 96 70 - 99 mg/dL   BUN 7 (L) 8 - 23 mg/dL   Creatinine, Ser 4.090.74 0.44 - 1.00 mg/dL   Calcium 8.0 (L) 8.9 - 10.3 mg/dL   Total Protein 5.4 (L) 6.5 - 8.1 g/dL   Albumin 3.0 (L) 3.5 - 5.0 g/dL   AST 47 (H) 15 - 41 U/L   ALT 81 (H) 0 - 44 U/L   Alkaline Phosphatase 58 38 - 126 U/L   Total Bilirubin 1.1 0.3 - 1.2 mg/dL   GFR calc non Af Amer >60 >60 mL/min   GFR calc Af Amer >60 >60 mL/min   Anion gap 7 5 - 15    Comment: Performed at [facility], [address] [place], [place] [address]  CBC     Status: Abnormal   Collection Time: 11/22/18  2:55 AM  Result Value Ref Range   WBC 14.3 (H) 4.0 - 10.5 K/uL   RBC 4.10 3.87 - 5.11 MIL/uL   Hemoglobin 12.1 12.0 - 15.0 g/dL   HCT 47.836.9 29.536.0 - 62.146.0 %   MCV 90.0 80.0 -  100.0 fL   MCH 29.5 26.0 - 34.0 pg   MCHC 32.8 30.0 - 36.0 g/dL   RDW 30.813.2 65.711.5 - 84.615.5 %   Platelets 239 150 - 400 K/uL   nRBC 0.0 0.0 - 0.2 %    Comment: Performed at [facility], [address] [place], [place] [address]  Comprehensive metabolic panel     Status: Abnormal   Collection Time: 11/22/18  2:55 AM  Result Value Ref Range   Sodium 137 135 - 145 mmol/L   Potassium 3.6 3.5 - 5.1 mmol/L   Chloride 106 98 - 111 mmol/L   CO2 19 (L) 22 - 32 mmol/L   Glucose, Bld 92 70 - 99 mg/dL   BUN 5 (L) 8 - 23 mg/dL   Creatinine, Ser 2.840.61 0.44 - 1.00 mg/dL   Calcium 8.1 (L) 8.9 - 10.3 mg/dL   Total Protein 5.6 (L) 6.5 - 8.1 g/dL   Albumin 2.9 (L) 3.5 - 5.0 g/dL   AST 40 15 - 41 U/L   ALT 58 (H) 0 - 44 U/L  Alkaline Phosphatase 67 38 - 126 U/L   Total Bilirubin 1.8 (H) 0.3 - 1.2 mg/dL   GFR calc non Af Amer >60 >60 mL/min   GFR calc Af Amer >60 >60 mL/min   Anion gap 12 5 - 15    Comment: Performed at [facility], [address] [place], [place] [address]  Magnesium     Status: None   Collection Time: 11/22/18  2:55 AM  Result Value Ref Range   Magnesium 1.7 1.7 - 2.4 mg/dL    Comment: Performed at [facility], [address], [place], [place] [address]  Lipase, blood     Status: None   Collection Time: 11/22/18  2:55 AM  Result Value Ref Range   Lipase 40 11 - 51 U/L    Comment: Performed at [facility], [address], [place], [place] [address]    Radiology/Results: Dg Chest Port 1 View  Result Date: 11/21/2018 CLINICAL DATA:  Cough. EXAM: PORTABLE CHEST 1 VIEW COMPARISON:  Chest x-ray dated November 19, 2018. FINDINGS: The heart size and mediastinal contours are within normal limits. Normal pulmonary vascularity. New small bilateral pleural effusions with bibasilar atelectasis. No pneumothorax. No acute osseous abnormality. IMPRESSION: 1. New small bilateral pleural effusions with bibasilar atelectasis. Electronically Signed   By: Obie Dredge  M.D.   On: 11/21/2018 11:57    Anti-infectives: Anti-infectives (From admission, onward)   None      Assessment/Plan: Problem List: Patient Active Problem List   Diagnosis Date Noted  . Acute pancreatitis 11/19/2018  . Physical exam 02/04/2015  . Overweight (BMI 25.0-29.9) 02/04/2015  . Fatigue 08/05/2014  . Slow transit constipation 08/12/2012  . FATIGUE 03/02/2009  . ABDOMINAL PAIN, LEFT LOWER QUADRANT 03/02/2009  . DIVERTICULITIS, HX OF 03/02/2009    Lipase down from 1000 to 40;  Curious that ultrasound showed gallstones and MRCP showed none in either the CBD or the gallbladder.  Maybe MRCP is lacking in sensitivity.  T Bili is up to 1.8.  Will recheck lab and let her abdominal pain get better and hopefully edema subside before proceeding with lap chole.   * No surgery found *    LOS: 2 days   Matt B. Daphine Deutscher, MD, [facility] [phone] beeper [phone]  11/22/2018 7:54 AM

## 2018-11-22 NOTE — Progress Notes (Signed)
PROGRESS NOTE    [name]  Mosley:[id number] DOB: 1950/12/05 DOA: 11/19/2018 PCP: Sheliah Hatch, MD   Brief Narrative:  Joy Mosley is Joy Mosley 68 y.o. female with medical history significant of diverticulitis presenting to the hospital for evaluation of abdominal pain.  Patient states she ate lunch at noon 1/29 and then took Yuliet Needs nap.  She woke up from her nap within 30 minutes with acute onset severe epigastric pain radiating to her back.  She describes the pain as squeezing in character.  Also reports having associated nausea and vomiting.  Denies having any fevers or chills.  Denies having any chest pain.  States she drinks alcohol only socially; had an [place] coffee [employer] week ago.  Assessment & Plan:   Active Problems:   Acute pancreatitis  Acute Pancreatitis: No meds, rare etoh, normal triglycerides. Gallstone pancreatitis suspected given [place] (interestingly, MRI did not show gallstones, discussed this with radiology who noted gallstones on [place], but did note that it was unusual MRI was not showing stones). She still has some pain, lipase is improved, LFT's improved, but bili is up Ayan [place] bit Will try clear liquid diet today Continue LR for now at 150 cc/hr Dilaudid, zofran for pain/nausea Strict I/O, daily weights GI c/s, appreciate recs Will consult surgery  Cough  Bilateral Small Pleural Effusions:   Pt remains on RA, no increased WOB, will follow repeat CXR tomorrow.  Continue IVF at current rate for now.  Elevated LFT's: suspect 2/2 above, follow, improving, but bili elevated today, follow   Leukocytosis: afebrile, continue to monitor closely.   NAGMA: follow, continue to monitor  DVT prophylaxis: lovenox Code Status: full Family Communication: none at bedside Disposition Plan: continue inpatient given persistent abdominal pain related to pancreatitis, need for surgical eval  Consultants:   GI  Surgery  Procedures:   none  Antimicrobials:  Anti-infectives (From  admission, onward)   None         Subjective: Maybe Donnah Levert little better? Persistent abdominal pain.  Objective: Vitals:   11/21/18 1342 11/21/18 2108 11/22/18 0527 11/22/18 1349  BP: 122/68 135/67 133/70 (!) 141/70  Pulse: 85 92 89 89  Resp: 17 18 18 17   Temp: 98.9 F (37.2 C) 99.5 F (37.5 C) 99.1 F (37.3 C)   TempSrc: Oral Oral Oral   SpO2: 94% 93% 93% 94%  Weight:   86.1 kg   Height:        Intake/Output Summary (Last 24 hours) at 11/22/2018 1441 Last data filed at 11/22/2018 1305 Gross per 24 hour  Intake 4503.79 ml  Output 200 ml  Net 4303.79 ml   Filed Weights   11/19/18 2203 11/20/18 1532 11/22/18 0527  Weight: 77.1 kg 77.1 kg 86.1 kg    Examination:  General: No acute distress. Cardiovascular: Heart sounds show Cythnia [place] regular rate, and rhythm.  Lungs: Clear to auscultation bilaterally Abdomen: TTP in epigastric and LUQ Neurological: Alert and oriented 3. Moves all extremities 4. Cranial nerves II through XII grossly intact. Skin: Warm and dry. No rashes or lesions. Extremities: No clubbing or cyanosis. No edema. Pedal pulses 2+. Psychiatric: Mood and affect are normal. Insight and judgment are appropriate.    Data Reviewed: I have personally reviewed following labs and imaging studies  CBC: Recent Labs  Lab 11/19/18 1500 11/22/18 0255  WBC 7.3 14.3*  HGB 13.4 12.1  HCT 41.4 36.9  MCV 91.2 90.0  PLT 273 239   Basic Metabolic Panel: Recent Labs  Lab 11/19/18 1500  11/21/18 0114 11/22/18 0255  NA 138 142 137  K 4.1 3.7 3.6  CL 105 113* 106  CO2 24 22 19*  GLUCOSE 127* 96 92  BUN 17 7* 5*  CREATININE 0.67 0.74 0.61  CALCIUM 8.7* 8.0* 8.1*  MG  --   --  1.7   GFR: Estimated Creatinine Clearance: 70.5 mL/min (by C-G formula based on SCr of 0.61 mg/dL). Liver Function Tests: Recent Labs  Lab 11/19/18 1500 11/20/18 0558 11/21/18 0114 11/22/18 0255  AST 112* 128* 47* 40  ALT 56* 137* 81* 58*  ALKPHOS 71 69 58 67  BILITOT 0.7 0.9 1.1  1.8*  PROT 6.5 6.0* 5.4* 5.6*  ALBUMIN 3.6 3.4* 3.0* 2.9*   Recent Labs  Lab 11/19/18 1500 11/20/18 0558 11/22/18 0255  LIPASE 3,377* 1,007* 40   No results for input(s): AMMONIA in the last 168 hours. Coagulation Profile: No results for input(s): INR, PROTIME in the last 168 hours. Cardiac Enzymes: No results for input(s): CKTOTAL, CKMB, CKMBINDEX, TROPONINI in the last 168 hours. BNP (last 3 results) No results for input(s): PROBNP in the last 8760 hours. HbA1C: No results for input(s): HGBA1C in the last 72 hours. CBG: No results for input(s): GLUCAP in the last 168 hours. Lipid Profile: Recent Labs    11/19/18 1915  CHOL 185  HDL 48  LDLCALC 107*  TRIG 148  CHOLHDL 3.9   Thyroid Function Tests: No results for input(s): TSH, T4TOTAL, FREET4, T3FREE, THYROIDAB in the last 72 hours. Anemia Panel: No results for input(s): VITAMINB12, FOLATE, FERRITIN, TIBC, IRON, RETICCTPCT in the last 72 hours. Sepsis Labs: No results for input(s): PROCALCITON, LATICACIDVEN in the last 168 hours.  No results found for this or any previous visit (from the past 240 hour(s)).       Radiology Studies: Dg Chest Port 1 View  Result Date: 11/21/2018 CLINICAL DATA:  Cough. EXAM: PORTABLE CHEST 1 VIEW COMPARISON:  Chest x-ray dated November 19, 2018. FINDINGS: The heart size and mediastinal contours are within normal limits. Normal pulmonary vascularity. New small bilateral pleural effusions with bibasilar atelectasis. No pneumothorax. No acute osseous abnormality. IMPRESSION: 1. New small bilateral pleural effusions with bibasilar atelectasis. Electronically Signed   By: Obie Dredge M.D.   On: 11/21/2018 11:57        Scheduled Meds: . enoxaparin (LOVENOX) injection  40 mg Subcutaneous Q24H  . sodium chloride flush  3 mL Intravenous Once   Continuous Infusions: . lactated ringers 150 mL/hr at 11/21/18 2350     LOS: 2 days    Time spent: over 30 min    Lacretia Nicks,  MD [facility] Pager AMION  If 7PM-7AM, please contact night-coverage [email or web address] Password [facility] 11/22/2018, 2:41 PM

## 2018-11-22 NOTE — Progress Notes (Signed)
Called about PRn treatment for patient having difficulty breathing. After assessing patient she has clear BBS throughout but her trouble she stated is more from the pain of taking a deep breath. No PRN done at this time. Patient also has no history to call for need of treatment, will inform RN about patient pain.

## 2018-11-23 ENCOUNTER — Inpatient Hospital Stay (HOSPITAL_COMMUNITY): Payer: Medicare Other

## 2018-11-23 DIAGNOSIS — I361 Nonrheumatic tricuspid (valve) insufficiency: Secondary | ICD-10-CM

## 2018-11-23 DIAGNOSIS — I340 Nonrheumatic mitral (valve) insufficiency: Secondary | ICD-10-CM

## 2018-11-23 LAB — COMPREHENSIVE METABOLIC PANEL
ALT: 47 U/L — ABNORMAL HIGH (ref 0–44)
ANION GAP: 12 (ref 5–15)
AST: 28 U/L (ref 15–41)
Albumin: 2.5 g/dL — ABNORMAL LOW (ref 3.5–5.0)
Alkaline Phosphatase: 78 U/L (ref 38–126)
BUN: 6 mg/dL — ABNORMAL LOW (ref 8–23)
CO2: 21 mmol/L — ABNORMAL LOW (ref 22–32)
Calcium: 7.9 mg/dL — ABNORMAL LOW (ref 8.9–10.3)
Chloride: 105 mmol/L (ref 98–111)
Creatinine, Ser: 0.64 mg/dL (ref 0.44–1.00)
GFR calc Af Amer: >60 mL/min (ref >60)
GFR calc non Af Amer: >60 mL/min (ref >60)
Glucose, Bld: 86 mg/dL (ref 70–99)
POTASSIUM: 3.4 mmol/L — AB (ref 3.5–5.1)
Sodium: 138 mmol/L (ref 135–145)
Total Bilirubin: 1.5 mg/dL — ABNORMAL HIGH (ref 0.3–1.2)
Total Protein: 5.2 g/dL — ABNORMAL LOW (ref 6.5–8.1)

## 2018-11-23 LAB — SURGICAL PCR SCREEN
MRSA, PCR: NEGATIVE
Staphylococcus aureus: NEGATIVE

## 2018-11-23 LAB — LIPASE, BLOOD: Lipase: 20 U/L (ref 11–51)

## 2018-11-23 LAB — CBC
HCT: 34.1 % — ABNORMAL LOW (ref 36.0–46.0)
HEMOGLOBIN: 11.4 g/dL — AB (ref 12.0–15.0)
MCH: 30.3 pg (ref 26.0–34.0)
MCHC: 33.4 g/dL (ref 30.0–36.0)
MCV: 90.7 fL (ref 80.0–100.0)
NRBC: 0 % (ref 0.0–0.2)
Platelets: 233 10*3/uL (ref 150–400)
RBC: 3.76 MIL/uL — AB (ref 3.87–5.11)
RDW: 13.3 % (ref 11.5–15.5)
WBC: 12.2 10*3/uL — ABNORMAL HIGH (ref 4.0–10.5)

## 2018-11-23 LAB — ECHOCARDIOGRAM COMPLETE
Height: 64 in
Weight: 3037.06 oz

## 2018-11-23 LAB — BRAIN NATRIURETIC PEPTIDE: B Natriuretic Peptide: 331.4 pg/mL — ABNORMAL HIGH (ref 0.0–100.0)

## 2018-11-23 LAB — MAGNESIUM: Magnesium: 1.7 mg/dL (ref 1.7–2.4)

## 2018-11-23 MED ORDER — HYDROMORPHONE HCL 1 MG/ML IJ SOLN: 0.5000 mg | INTRAMUSCULAR | Status: DC | PRN

## 2018-11-23 MED ORDER — POTASSIUM CHLORIDE CRYS ER 20 MEQ PO TBCR
40.0000 meq | EXTENDED_RELEASE_TABLET | Freq: Once | ORAL | Status: AC
  Administered 2018-11-23: 40 meq via ORAL
  Filled 2018-11-23: qty 2

## 2018-11-23 MED ORDER — POTASSIUM CHLORIDE 20 MEQ PO PACK
40.0000 meq | PACK | Freq: Once | ORAL | Status: DC
  Filled 2018-11-23: qty 2

## 2018-11-23 MED ORDER — FUROSEMIDE 10 MG/ML IJ SOLN
40.0000 mg | Freq: Once | INTRAMUSCULAR | Status: AC
  Administered 2018-11-23: 40 mg via INTRAVENOUS
  Filled 2018-11-23: qty 4

## 2018-11-23 MED ORDER — OXYCODONE HCL 5 MG PO TABS: 5.0000 mg | ORAL_TABLET | ORAL | Status: DC | PRN

## 2018-11-23 MED ORDER — POLYETHYLENE GLYCOL 3350 17 G PO PACK
17.0000 g | PACK | Freq: Every day | ORAL | Status: DC
  Administered 2018-11-23 – 2018-11-26 (×2): 17 g via ORAL
  Filled 2018-11-23 (×3): qty 1

## 2018-11-23 NOTE — Progress Notes (Signed)
Patient ID: Joy Mosley, female   DOB: 1951/04/19, 68 y.o.   MRN: [id number] [facility] Progress Note:   * No surgery found *  Subjective: Mental status is fairly alert;  Had more pain in the left flank last night.   Objective: Vital signs in last 24 hours: Temp:  [98.7 F (37.1 C)-99.3 F (37.4 C)] 99.1 F (37.3 C) (02/02 0601) Pulse Rate:  [82-98] 82 (02/02 0601) Resp:  [17-18] 18 (02/01 1836) BP: (128-145)/(68-70) 128/69 (02/02 0601) SpO2:  [86 %-95 %] 95 % (02/02 0601) Weight:  [86.1 kg] 86.1 kg (02/02 0500)  Intake/Output from previous day: 02/01 0701 - 02/02 0700 In: 1578.1 [I.V.:1578.1] Out: 300 [Urine:300] Intake/Output this shift: No intake/output data recorded.  Physical Exam: Work of breathing is not labored; ;  Lab Results:  Results for orders placed or performed during the hospital encounter of 11/19/18 (from the past 48 hour(s))  CBC     Status: Abnormal   Collection Time: 11/22/18  2:55 AM  Result Value Ref Range   WBC 14.3 (H) 4.0 - 10.5 K/uL   RBC 4.10 3.87 - 5.11 MIL/uL   Hemoglobin 12.1 12.0 - 15.0 g/dL   HCT 27.0 62.3 - 76.2 %   MCV 90.0 80.0 - 100.0 fL   MCH 29.5 26.0 - 34.0 pg   MCHC 32.8 30.0 - 36.0 g/dL   RDW 83.1 51.7 - 61.6 %   Platelets 239 150 - 400 K/uL   nRBC 0.0 0.0 - 0.2 %    Comment: Performed at [facility], [address], [place], [place] [address]  Comprehensive metabolic panel     Status: Abnormal   Collection Time: 11/22/18  2:55 AM  Result Value Ref Range   Sodium 137 135 - 145 mmol/L   Potassium 3.6 3.5 - 5.1 mmol/L   Chloride 106 98 - 111 mmol/L   CO2 19 (L) 22 - 32 mmol/L   Glucose, Bld 92 70 - 99 mg/dL   BUN 5 (L) 8 - 23 mg/dL   Creatinine, Ser 0.62 0.44 - 1.00 mg/dL   Calcium 8.1 (L) 8.9 - 10.3 mg/dL   Total Protein 5.6 (L) 6.5 - 8.1 g/dL   Albumin 2.9 (L) 3.5 - 5.0 g/dL   AST 40 15 - 41 U/L   ALT 58 (H) 0 - 44 U/L   Alkaline Phosphatase 67 38 - 126 U/L   Total Bilirubin 1.8 (H) 0.3 - 1.2  mg/dL   GFR calc non Af Amer >60 >60 mL/min   GFR calc Af Amer >60 >60 mL/min   Anion gap 12 5 - 15    Comment: Performed at [facility], [address], [place], [place] [address]  Magnesium     Status: None   Collection Time: 11/22/18  2:55 AM  Result Value Ref Range   Magnesium 1.7 1.7 - 2.4 mg/dL    Comment: Performed at [facility], [address] [place], [place] [address]  Lipase, blood     Status: None   Collection Time: 11/22/18  2:55 AM  Result Value Ref Range   Lipase 40 11 - 51 U/L    Comment: Performed at [facility], [address] [place], [place] [address]  Comprehensive metabolic panel     Status: Abnormal   Collection Time: 11/23/18  4:15 AM  Result Value Ref Range   Sodium 138 135 - 145 mmol/L   Potassium 3.4 (L) 3.5 -  5.1 mmol/L   Chloride 105 98 - 111 mmol/L   CO2 21 (L) 22 - 32 mmol/L   Glucose, Bld 86 70 - 99 mg/dL   BUN 6 (L) 8 - 23 mg/dL   Creatinine, Ser 4.690.64 0.44 - 1.00 mg/dL   Calcium 7.9 (L) 8.9 - 10.3 mg/dL   Total Protein 5.2 (L) 6.5 - 8.1 g/dL   Albumin 2.5 (L) 3.5 - 5.0 g/dL   AST 28 15 - 41 U/L   ALT 47 (H) 0 - 44 U/L   Alkaline Phosphatase 78 38 - 126 U/L   Total Bilirubin 1.5 (H) 0.3 - 1.2 mg/dL   GFR calc non Af Amer >60 >60 mL/min   GFR calc Af Amer >60 >60 mL/min   Anion gap 12 5 - 15    Comment: Performed at [facility], [address] [place], [place] [address]  Lipase, blood     Status: None   Collection Time: 11/23/18  4:15 AM  Result Value Ref Range   Lipase 20 11 - 51 U/L    Comment: Performed at [facility], [address] [place], [place] [address]  CBC     Status: Abnormal   Collection Time: 11/23/18  4:15 AM  Result Value Ref Range   WBC 12.2 (H) 4.0 - 10.5 K/uL   RBC 3.76 (L) 3.87 - 5.11 MIL/uL   Hemoglobin 11.4 (L) 12.0 - 15.0 g/dL   HCT 44.034.1 (L) 10.236.0 - 72.546.0 %   MCV 90.7 80.0 - 100.0 fL   MCH 30.3 26.0 - 34.0 pg   MCHC 33.4 30.0 - 36.0 g/dL   RDW 36.613.3 44.011.5 - 34.715.5 %   Platelets  233 150 - 400 K/uL   nRBC 0.0 0.0 - 0.2 %    Comment: Performed at [facility], [address] [place], [place] [address]  Magnesium     Status: None   Collection Time: 11/23/18  4:15 AM  Result Value Ref Range   Magnesium 1.7 1.7 - 2.4 mg/dL    Comment: Performed at [facility], [address] [place], [place] [address]    Radiology/Results: Dg Chest 2 View  Result Date: 11/23/2018 CLINICAL DATA:  Abnormal chest x-ray. EXAM: CHEST - 2 VIEW COMPARISON:  Radiograph November 21, 2018. FINDINGS: Stable cardiomediastinal silhouette. No pneumothorax is noted. Stable bibasilar atelectasis or edema is noted with associated pleural effusions. Bony thorax is unremarkable. IMPRESSION: Stable bibasilar atelectasis or edema is noted with mild pleural effusions. Electronically Signed   By: Lupita RaiderJames  Green Jr, M.D.   On: 11/23/2018 07:53   Dg Chest Port 1 View  Result Date: 11/21/2018 CLINICAL DATA:  Cough. EXAM: PORTABLE CHEST 1 VIEW COMPARISON:  Chest x-ray dated November 19, 2018. FINDINGS: The heart size and mediastinal contours are within normal limits. Normal pulmonary vascularity. New small bilateral pleural effusions with bibasilar atelectasis. No pneumothorax. No acute osseous abnormality. IMPRESSION: 1. New small bilateral pleural effusions with bibasilar atelectasis. Electronically Signed   By: Obie DredgeWilliam T Derry M.D.   On: 11/21/2018 11:57    Anti-infectives: Anti-infectives (From admission, onward)   None      Assessment/Plan: Problem List: Patient Active Problem List   Diagnosis Date Noted  . Acute pancreatitis 11/19/2018  . Physical exam 02/04/2015  . Overweight (BMI 25.0-29.9) 02/04/2015  . Fatigue 08/05/2014  . Slow transit constipation 08/12/2012  . FATIGUE 03/02/2009  . ABDOMINAL PAIN, LEFT LOWER QUADRANT 03/02/2009  . DIVERTICULITIS, HX OF  03/02/2009    Lipase down;  Taking clears;  Will make NPO and hopefuly lap chole tomorrow.   * No surgery found *    LOS: 3  days   Matt B. Daphine DeutscherMartin, MD, [facility]  [facility] [phone] beeper [phone]  11/23/2018 8:57 AM

## 2018-11-23 NOTE — Progress Notes (Signed)
  Echocardiogram 2D Echocardiogram has been performed.  Celene Skeen 11/23/2018, 2:50 PM

## 2018-11-23 NOTE — Progress Notes (Signed)
PROGRESS NOTE    Joy Mosley  ZOX:[id number] DOB: 06/03/1951 DOA: 11/19/2018 PCP: Sheliah Hatchabori, Katherine E, MD   Brief Narrative:  Joy Mosley is Joy Mosley 68 y.o. female with medical history significant of diverticulitis presenting to the hospital for evaluation of abdominal pain.  Patient states she ate lunch at noon 1/29 and then took Aysel Gilchrest nap.  She woke up from her nap within 30 minutes with acute onset severe epigastric pain radiating to her back.  She describes the pain as squeezing in character.  Also reports having associated nausea and vomiting.  Denies having any fevers or chills.  Denies having any chest pain.  States she drinks alcohol only socially; had an [place] coffee Brayon Biele[place] week ago.  Assessment & Plan:   Active Problems:   Acute pancreatitis  Acute Hypoxic Respiratory Failure  Dyspnea:  CXR with bibasilar atelectasis vs edema with mild pleural effusions.  Increased SOB last night.  Suspect this is related to volume overload as she appears grossly edematous on exam today.  Ddx includes atelectasis due to splinting with LUQ pain Lasix 40 x1, follow response Elevated BNP, follow echo D/c IVF Continue IS, flutter Wean O2 as tolerated  Acute Pancreatitis: No meds, rare etoh, normal triglycerides. Gallstone pancreatitis suspected given US (interestingly, MRI did not show gallstones, discussed this with radiology who noted gallstones on US, but did note that it was unusual MRI was not showing stones). She still has some pain, lipase is improved, LFT's improved, but bili is up Cavan Bearden bit Continue clear liquid diet Dilaudid, zofran for pain/nausea Strict I/O, daily weights GI c/s, appreciate recs Surgery planning for possible lap chole tomorrow  Cough  Bilateral Small Pleural Effusions:   As noted above  Elevated LFT's: improving, likely 2/2 above, follow   Leukocytosis: afebrile, continue to monitor closely. Improving, follow  NAGMA:  Improved, follow  Hypokalemia: replace and  follow  DVT prophylaxis: lovenox Code Status: full Family Communication: none at bedside Disposition Plan: continue inpatient given persistent abdominal pain related to pancreatitis, need for surgical eval  Consultants:   GI  Surgery  Procedures:   none  Antimicrobials:  Anti-infectives (From admission, onward)   None         Subjective: Persistent LUQ pain Pain with deep breaths  Objective: Vitals:   11/22/18 1847 11/22/18 2204 11/23/18 0500 11/23/18 0601  BP:  128/68  128/69  Pulse:  92  82  Resp:      Temp:  99.3 F (37.4 C)  99.1 F (37.3 C)  TempSrc:  Oral  Oral  SpO2: 94% 95%  95%  Weight:   86.1 kg   Height:        Intake/Output Summary (Last 24 hours) at 11/23/2018 1244 Last data filed at 11/22/2018 2302 Gross per 24 hour  Intake 1578.13 ml  Output 300 ml  Net 1278.13 ml   Filed Weights   11/20/18 1532 11/22/18 0527 11/23/18 0500  Weight: 77.1 kg 86.1 kg 86.1 kg    Examination:  General: No acute distress. Cardiovascular: Heart sounds show Alexine Pilant regular rate, and rhythm. Lungs: Clear to auscultation Abdomen: Soft, TTP in LUQ Neurological: Alert and oriented 3. Moves all extremities 4. Cranial nerves II through XII grossly intact. Skin: Warm and dry. No rashes or lesions. Extremities: Bilateral lower extremity edema Psychiatric: Mood and affect are normal. Insight and judgment are appropriate.    Data Reviewed: I have personally reviewed following labs and imaging studies  CBC: Recent Labs  Lab 11/19/18 1500  11/22/18 0255 11/23/18 0415  WBC 7.3 14.3* 12.2*  HGB 13.4 12.1 11.4*  HCT 41.4 36.9 34.1*  MCV 91.2 90.0 90.7  PLT 273 239 233   Basic Metabolic Panel: Recent Labs  Lab 11/19/18 1500 11/21/18 0114 11/22/18 0255 11/23/18 0415  NA 138 142 137 138  K 4.1 3.7 3.6 3.4*  CL 105 113* 106 105  CO2 24 22 19* 21*  GLUCOSE 127* 96 92 86  BUN 17 7* 5* 6*  CREATININE 0.67 0.74 0.61 0.64  CALCIUM 8.7* 8.0* 8.1* 7.9*  MG  --    --  1.7 1.7   GFR: Estimated Creatinine Clearance: 70.5 mL/min (by C-G formula based on SCr of 0.64 mg/dL). Liver Function Tests: Recent Labs  Lab 11/19/18 1500 11/20/18 0558 11/21/18 0114 11/22/18 0255 11/23/18 0415  AST 112* 128* 47* 40 28  ALT 56* 137* 81* 58* 47*  ALKPHOS 71 69 58 67 78  BILITOT 0.7 0.9 1.1 1.8* 1.5*  PROT 6.5 6.0* 5.4* 5.6* 5.2*  ALBUMIN 3.6 3.4* 3.0* 2.9* 2.5*   Recent Labs  Lab 11/19/18 1500 11/20/18 0558 11/22/18 0255 11/23/18 0415  LIPASE 3,377* 1,007* 40 20   No results for input(s): AMMONIA in the last 168 hours. Coagulation Profile: No results for input(s): INR, PROTIME in the last 168 hours. Cardiac Enzymes: No results for input(s): CKTOTAL, CKMB, CKMBINDEX, TROPONINI in the last 168 hours. BNP (last 3 results) No results for input(s): PROBNP in the last 8760 hours. HbA1C: No results for input(s): HGBA1C in the last 72 hours. CBG: No results for input(s): GLUCAP in the last 168 hours. Lipid Profile: No results for input(s): CHOL, HDL, LDLCALC, TRIG, CHOLHDL, LDLDIRECT in the last 72 hours. Thyroid Function Tests: No results for input(s): TSH, T4TOTAL, FREET4, T3FREE, THYROIDAB in the last 72 hours. Anemia Panel: No results for input(s): VITAMINB12, FOLATE, FERRITIN, TIBC, IRON, RETICCTPCT in the last 72 hours. Sepsis Labs: No results for input(s): PROCALCITON, LATICACIDVEN in the last 168 hours.  No results found for this or any previous visit (from the past 240 hour(s)).       Radiology Studies: Dg Chest 2 View  Result Date: 11/23/2018 CLINICAL DATA:  Abnormal chest x-ray. EXAM: CHEST - 2 VIEW COMPARISON:  Radiograph November 21, 2018. FINDINGS: Stable cardiomediastinal silhouette. No pneumothorax is noted. Stable bibasilar atelectasis or edema is noted with associated pleural effusions. Bony thorax is unremarkable. IMPRESSION: Stable bibasilar atelectasis or edema is noted with mild pleural effusions. Electronically Signed   By:  Lupita Raider, M.D.   On: 11/23/2018 07:53        Scheduled Meds: . enoxaparin (LOVENOX) injection  40 mg Subcutaneous Q24H  . potassium chloride  40 mEq Oral Once  . sodium chloride flush  3 mL Intravenous Once   Continuous Infusions:    LOS: 3 days    Time spent: over 30 min    Lacretia Nicks, MD [facility] Pager AMION  If 7PM-7AM, please contact night-coverage [email or web address] Password [facility] 11/23/2018, 12:44 PM

## 2018-11-23 NOTE — Plan of Care (Signed)
  Problem: Clinical Measurements: Goal: Ability to maintain clinical measurements within normal limits will improve Outcome: Progressing Goal: Will remain free from infection Outcome: Progressing   Problem: Activity: Goal: Risk for activity intolerance will decrease Outcome: Progressing   Problem: Nutrition: Goal: Adequate nutrition will be maintained Outcome: Progressing   Problem: Coping: Goal: Level of anxiety will decrease Outcome: Progressing   Problem: Pain Managment: Goal: General experience of comfort will improve Outcome: Progressing   Problem: Safety: Goal: Ability to remain free from injury will improve Outcome: Progressing   Problem: Skin Integrity: Goal: Risk for impaired skin integrity will decrease Outcome: Progressing   

## 2018-11-23 NOTE — Progress Notes (Signed)
Paged MD due to pt. complaining of SOB and +1 generalized edema. Received orders to decrease IV fluid rate to 75 mL/hr and prn nebulizer treatments. Will continue to monitor.

## 2018-11-24 LAB — COMPREHENSIVE METABOLIC PANEL
ALT: 55 U/L — ABNORMAL HIGH (ref 0–44)
AST: 52 U/L — ABNORMAL HIGH (ref 15–41)
Albumin: 2.4 g/dL — ABNORMAL LOW (ref 3.5–5.0)
Alkaline Phosphatase: 137 U/L — ABNORMAL HIGH (ref 38–126)
Anion gap: 11 (ref 5–15)
BUN: 7 mg/dL — ABNORMAL LOW (ref 8–23)
CO2: 26 mmol/L (ref 22–32)
Calcium: 7.9 mg/dL — ABNORMAL LOW (ref 8.9–10.3)
Chloride: 103 mmol/L (ref 98–111)
Creatinine, Ser: 0.66 mg/dL (ref 0.44–1.00)
GFR calc non Af Amer: >60 mL/min (ref >60)
Glucose, Bld: 100 mg/dL — ABNORMAL HIGH (ref 70–99)
Potassium: 3.3 mmol/L — ABNORMAL LOW (ref 3.5–5.1)
Sodium: 140 mmol/L (ref 135–145)
Total Bilirubin: 1 mg/dL (ref 0.3–1.2)
Total Protein: 5.2 g/dL — ABNORMAL LOW (ref 6.5–8.1)

## 2018-11-24 LAB — CBC
HCT: 34.1 % — ABNORMAL LOW (ref 36.0–46.0)
Hemoglobin: 11.2 g/dL — ABNORMAL LOW (ref 12.0–15.0)
MCH: 29.5 pg (ref 26.0–34.0)
MCHC: 32.8 g/dL (ref 30.0–36.0)
MCV: 89.7 fL (ref 80.0–100.0)
Platelets: 244 10*3/uL (ref 150–400)
RBC: 3.8 MIL/uL — ABNORMAL LOW (ref 3.87–5.11)
RDW: 13.1 % (ref 11.5–15.5)
WBC: 9.1 10*3/uL (ref 4.0–10.5)
nRBC: 0 % (ref 0.0–0.2)

## 2018-11-24 LAB — MAGNESIUM: MAGNESIUM: 1.8 mg/dL (ref 1.7–2.4)

## 2018-11-24 LAB — LIPASE, BLOOD: Lipase: 170 U/L — ABNORMAL HIGH (ref 11–51)

## 2018-11-24 MED ORDER — FUROSEMIDE 10 MG/ML IJ SOLN
40.0000 mg | Freq: Four times a day (QID) | INTRAMUSCULAR | Status: AC
  Administered 2018-11-24 (×2): 40 mg via INTRAVENOUS
  Filled 2018-11-24 (×2): qty 4

## 2018-11-24 MED ORDER — GLUCERNA SHAKE PO LIQD
237.0000 mL | Freq: Three times a day (TID) | ORAL | Status: DC
  Administered 2018-11-25 – 2018-11-26 (×3): 237 mL via ORAL

## 2018-11-24 MED ORDER — POTASSIUM CHLORIDE CRYS ER 20 MEQ PO TBCR
40.0000 meq | EXTENDED_RELEASE_TABLET | Freq: Once | ORAL | Status: AC
  Administered 2018-11-24: 40 meq via ORAL
  Filled 2018-11-24: qty 2

## 2018-11-24 MED ORDER — POTASSIUM CHLORIDE 10 MEQ/100ML IV SOLN
10.0000 meq | INTRAVENOUS | Status: DC
  Administered 2018-11-24: 10 meq via INTRAVENOUS
  Filled 2018-11-24: qty 100

## 2018-11-24 NOTE — Progress Notes (Signed)
PROGRESS NOTE    Joy Mosley  FMM:[id number] DOB: 06/12/1951 DOA: 11/19/2018 PCP: Midge Minium, MD   Brief Narrative:  Joy Mosley is Joy Mosley 68 y.o. female with medical history significant of diverticulitis presenting to the hospital for evaluation of abdominal pain.  Patient states she ate lunch at noon 1/29 and then took Joy Mosley nap.  She woke up from her nap within 30 minutes with acute onset severe epigastric pain radiating to her back.  She describes the pain as squeezing in character.  Also reports having associated nausea and vomiting.  Denies having any fevers or chills.  Denies having any chest pain.  States she drinks alcohol only socially; had an [place] coffee Cary Lothrop week ago.  Assessment & Plan:   Active Problems:   Acute pancreatitis  Acute Hypoxic Respiratory Failure  Dyspnea:  CXR with bibasilar atelectasis vs edema with mild pleural effusions on 2/2.  SOB improved, but still some pleuritic CP with deep breaths on the L side I suspect is 2/2 her pancreatitis. Improved with lasix, will give another 2 doses today as she still has some edema, expect she'll likely be ready for surgery tomorrow EF 60-65% with mildly elevated RVSP (would recommend outpatient follow up for this) Continue IS, flutter Currently on RA  Acute Pancreatitis: No meds, rare etoh, normal triglycerides. Gallstone pancreatitis suspected given [place] (interestingly, MRI did not show gallstones, discussed this with radiology who noted gallstones on [place], but did note that it was unusual MRI was not showing stones). She still has some pain, lipase is up again (not clear how clinically significant this is as she feels gradually better), bili improved, alk phos/AST/ALT slightly up today Continue clear liquid diet Dilaudid, zofran for pain/nausea Strict I/O, daily weights GI c/s, appreciate recs Surgery planning for possible lap chole possibly tomorrow  Cough  Bilateral Small Pleural Effusions:   As noted  above  Elevated LFT's: as noted above, follow.  Follow acute hepatitis panel.   Leukocytosis: afebrile, improved.  Continue to monitor.   NAGMA:  Improved, follow  Hypokalemia: replace and follow  DVT prophylaxis: lovenox Code Status: full Family Communication: none at bedside Disposition Plan: continue inpatient given persistent abdominal pain related to pancreatitis, need for surgical eval  Consultants:   GI  Surgery  Procedures:   none  Antimicrobials:  Anti-infectives (From admission, onward)   None         Subjective: Abdominal pain seems improved, still present Swelling improved, but persistent  Objective: Vitals:   11/23/18 0601 11/23/18 1253 11/23/18 2001 11/24/18 0501  BP: 128/69 123/72 (!) 144/73 (!) 147/70  Pulse: 82 87 92 85  Resp:  _0 Temp: 99.1 F (37.3 C) 99.6 F (37.6 C) 99.5 F (37.5 C) 98.4 F (36.9 C)  TempSrc: Oral Oral Oral Oral  SpO2: 95% 94% 90% 94%  Weight:      Height:        Intake/Output Summary (Last 24 hours) at 11/24/2018 1041 Last data filed at 11/24/2018 0957 Gross per 24 hour  Intake 733.98 ml  Output 2600 ml  Net -1866.02 ml   Filed Weights   11/20/18 1532 11/22/18 0527 11/23/18 0500  Weight: 77.1 kg 86.1 kg 86.1 kg    Examination:  General: No acute distress. Cardiovascular: Heart sounds show Joy Mosley regular rate, and rhythm Lungs: Clear to auscultation bilaterally, splinting due to LUQ pain Abdomen: Soft, TTP in epigastric and LUQ Neurological: Alert and oriented 3. Moves all extremities 4. Cranial nerves  II through XII grossly intact. Skin: Warm and dry. No rashes or lesions. Extremities: No clubbing or cyanosis. Bilateral LE edema, 1 + Psychiatric: Mood and affect are normal. Insight and judgment are appropriate.   Data Reviewed: I have personally reviewed following labs and imaging studies  CBC: Recent Labs  Lab 11/19/18 1500 11/22/18 0255 11/23/18 0415 11/24/18 0248  WBC 7.3 14.3* 12.2* 9.1   HGB 13.4 12.1 11.4* 11.2*  HCT 41.4 36.9 34.1* 34.1*  MCV 91.2 90.0 90.7 89.7  PLT 273 239 233 604   Basic Metabolic Panel: Recent Labs  Lab 11/19/18 1500 11/21/18 0114 11/22/18 0255 11/23/18 0415 11/24/18 0248  NA 138 142 137 138 140  K 4.1 3.7 3.6 3.4* 3.3*  CL 105 113* 106 105 103  CO2 24 22 19* 21* 26  GLUCOSE 127* 96 92 86 100*  BUN 17 7* 5* 6* 7*  CREATININE 0.67 0.74 0.61 0.64 0.66  CALCIUM 8.7* 8.0* 8.1* 7.9* 7.9*  MG  --   --  1.7 1.7 1.8   GFR: Estimated Creatinine Clearance: 70.5 mL/min (by C-G formula based on SCr of 0.66 mg/dL). Liver Function Tests: Recent Labs  Lab 11/20/18 0558 11/21/18 0114 11/22/18 0255 11/23/18 0415 11/24/18 0248  AST 128* 47* 40 28 52*  ALT 137* 81* 58* 47* 55*  ALKPHOS 69 58 67 78 137*  BILITOT 0.9 1.1 1.8* 1.5* 1.0  PROT 6.0* 5.4* 5.6* 5.2* 5.2*  ALBUMIN 3.4* 3.0* 2.9* 2.5* 2.4*   Recent Labs  Lab 11/19/18 1500 11/20/18 0558 11/22/18 0255 11/23/18 0415 11/24/18 0248  LIPASE 3,377* 1,007* 40 20 170*   No results for input(s): AMMONIA in the last 168 hours. Coagulation Profile: No results for input(s): INR, PROTIME in the last 168 hours. Cardiac Enzymes: No results for input(s): CKTOTAL, CKMB, CKMBINDEX, TROPONINI in the last 168 hours. BNP (last 3 results) No results for input(s): PROBNP in the last 8760 hours. HbA1C: No results for input(s): HGBA1C in the last 72 hours. CBG: No results for input(s): GLUCAP in the last 168 hours. Lipid Profile: No results for input(s): CHOL, HDL, LDLCALC, TRIG, CHOLHDL, LDLDIRECT in the last 72 hours. Thyroid Function Tests: No results for input(s): TSH, T4TOTAL, FREET4, T3FREE, THYROIDAB in the last 72 hours. Anemia Panel: No results for input(s): VITAMINB12, FOLATE, FERRITIN, TIBC, IRON, RETICCTPCT in the last 72 hours. Sepsis Labs: No results for input(s): PROCALCITON, LATICACIDVEN in the last 168 hours.  Recent Results (from the past 240 hour(s))  Surgical pcr screen      Status: None   Collection Time: 11/23/18  7:54 PM  Result Value Ref Range Status   MRSA, PCR NEGATIVE NEGATIVE Final   Staphylococcus aureus NEGATIVE NEGATIVE Final    Comment: (NOTE) The Xpert SA Assay (FDA approved for NASAL specimens in patients 43 years of age and older), is one component of Tyleek Smick comprehensive surveillance program. It is not intended to diagnose infection nor to guide or monitor treatment. Performed at [facility], [place] [address], [place], [place] [address]          Radiology Studies: Dg Chest 2 View  Result Date: 11/23/2018 CLINICAL DATA:  Abnormal chest x-ray. EXAM: CHEST - 2 VIEW COMPARISON:  Radiograph November 21, 2018. FINDINGS: Stable cardiomediastinal silhouette. No pneumothorax is noted. Stable bibasilar atelectasis or edema is noted with associated pleural effusions. Bony thorax is unremarkable. IMPRESSION: Stable bibasilar atelectasis or edema is noted with mild pleural effusions. Electronically Signed   By: Marijo Conception,  M.D.   On: 11/23/2018 07:53        Scheduled Meds: . enoxaparin (LOVENOX) injection  40 mg Subcutaneous Q24H  . feeding supplement (GLUCERNA SHAKE)  237 mL Oral TID BM  . furosemide  40 mg Intravenous Q6H  . polyethylene glycol  17 g Oral Daily  . potassium chloride  40 mEq Oral Once  . sodium chloride flush  3 mL Intravenous Once   Continuous Infusions:    LOS: 4 days    Time spent: over 30 min    Fayrene Helper, MD [facility] Pager AMION  If 7PM-7AM, please contact night-coverage [email or web address] Password [email or web address] 11/24/2018, 10:41 AM

## 2018-11-24 NOTE — Progress Notes (Signed)
  Progress Note: General Surgery Service   Assessment/Plan: Active Problems:   Acute pancreatitis some fluid overload- responded to lasix yesterday, still edema today, [place] noted dilated IVC without increased RA pressure  Gallstone pancreatitis - symptoms improving Plan for surgery when medically optimized We discussed the etiology of her pain, we discussed treatment options and recommended surgery. We discussed details of surgery including general anesthesia, laparoscopic approach, identification of cystic duct and common bile duct. Ligation of cystic duct and cystic artery. Possible need for intraoperative cholangiogram or open procedure. Possible risks of common bile duct injury, liver injury, cystic duct leak, bleeding, infection, post-cholecystectomy syndrome. The patient showed good understanding and all questions were answered   LOS: 4 days  Chief Complaint/Subjective: Pain improved, no nausea overnight  Objective: Vital signs in last 24 hours: Temp:  [98.4 F (36.9 C)-99.6 F (37.6 C)] 98.4 F (36.9 C) (02/03 0501) Pulse Rate:  [85-92] 85 (02/03 0501) Resp:  [18-19] 18 (02/03 0501) BP: (123-147)/(70-73) 147/70 (02/03 0501) SpO2:  [90 %-94 %] 94 % (02/03 0501) Last BM Date: 11/22/18  Intake/Output from previous day: 02/02 0701 - 02/03 0700 In: 734 [P.O.:560; I.V.:174] Out: 2600 [Urine:2600] Intake/Output this shift: No intake/output data recorded.  Lungs: CTAB  Cardiovascular: RRR  Abd: soft, slight epigastric tenderness  Extremities: 2+ edema b/l  Neuro: AOx4  Lab Results: CBC  Recent Labs    11/23/18 0415 11/24/18 0248  WBC 12.2* 9.1  HGB 11.4* 11.2*  HCT 34.1* 34.1*  PLT 233 244   BMET Recent Labs    11/23/18 0415 11/24/18 0248  NA 138 140  K 3.4* 3.3*  CL 105 103  CO2 21* 26  GLUCOSE 86 100*  BUN 6* 7*  CREATININE 0.64 0.66  CALCIUM 7.9* 7.9*   PT/INR No results for input(s): LABPROT, INR in the last 72 hours. ABG No results for  input(s): PHART, HCO3 in the last 72 hours.  Invalid input(s): PCO2, PO2  Studies/Results:  Anti-infectives: Anti-infectives (From admission, onward)   None      Medications: Scheduled Meds: . enoxaparin (LOVENOX) injection  40 mg Subcutaneous Q24H  . polyethylene glycol  17 g Oral Daily  . sodium chloride flush  3 mL Intravenous Once   Continuous Infusions: . potassium chloride 10 mEq (11/24/18 0755)   PRN Meds:.HYDROmorphone (DILAUDID) injection, ipratropium-albuterol, ondansetron (ZOFRAN) IV, oxyCODONE  Rodman Pickle, MD [facility]

## 2018-11-25 ENCOUNTER — Inpatient Hospital Stay (HOSPITAL_COMMUNITY): Payer: Medicare Other

## 2018-11-25 ENCOUNTER — Inpatient Hospital Stay (HOSPITAL_COMMUNITY): Payer: Medicare Other | Admitting: Registered Nurse

## 2018-11-25 ENCOUNTER — Encounter (HOSPITAL_COMMUNITY)
Admission: EM | Disposition: A | Discharge status: Home / Self Care | Payer: Self-pay | Source: Home / Self Care | Attending: Family Medicine

## 2018-11-25 HISTORY — PX: CHOLECYSTECTOMY: SHX55

## 2018-11-25 LAB — LIPASE, BLOOD: Lipase: 26 U/L (ref 11–51)

## 2018-11-25 LAB — COMPREHENSIVE METABOLIC PANEL
ALT: 44 U/L (ref 0–44)
AST: 30 U/L (ref 15–41)
Albumin: 2.7 g/dL — ABNORMAL LOW (ref 3.5–5.0)
Alkaline Phosphatase: 122 U/L (ref 38–126)
Anion gap: 7 (ref 5–15)
BILIRUBIN TOTAL: 1.3 mg/dL — AB (ref 0.3–1.2)
BUN: 9 mg/dL (ref 8–23)
CHLORIDE: 102 mmol/L (ref 98–111)
CO2: 30 mmol/L (ref 22–32)
Calcium: 8.3 mg/dL — ABNORMAL LOW (ref 8.9–10.3)
Creatinine, Ser: 0.79 mg/dL (ref 0.44–1.00)
GFR calc Af Amer: >60 mL/min (ref >60)
GFR calc non Af Amer: >60 mL/min (ref >60)
Glucose, Bld: 104 mg/dL — ABNORMAL HIGH (ref 70–99)
Potassium: 3.3 mmol/L — ABNORMAL LOW (ref 3.5–5.1)
Sodium: 139 mmol/L (ref 135–145)
Total Protein: 5.8 g/dL — ABNORMAL LOW (ref 6.5–8.1)

## 2018-11-25 LAB — HEPATITIS PANEL, ACUTE
Hep A IgM: NEGATIVE
Hep B C IgM: NEGATIVE
Hepatitis B Surface Ag: NEGATIVE

## 2018-11-25 LAB — CBC
HCT: 37.8 % (ref 36.0–46.0)
Hemoglobin: 12.8 g/dL (ref 12.0–15.0)
MCH: 29.6 pg (ref 26.0–34.0)
MCHC: 33.9 g/dL (ref 30.0–36.0)
MCV: 87.5 fL (ref 80.0–100.0)
NRBC: 0 % (ref 0.0–0.2)
Platelets: 302 10*3/uL (ref 150–400)
RBC: 4.32 MIL/uL (ref 3.87–5.11)
RDW: 12.8 % (ref 11.5–15.5)
WBC: 9 10*3/uL (ref 4.0–10.5)

## 2018-11-25 SURGERY — LAPAROSCOPIC CHOLECYSTECTOMY WITH INTRAOPERATIVE CHOLANGIOGRAM
Anesthesia: General | Site: Abdomen

## 2018-11-25 MED ORDER — 0.9 % SODIUM CHLORIDE (POUR BTL) OPTIME
TOPICAL | Status: DC | PRN
  Administered 2018-11-25: 1000 mL

## 2018-11-25 MED ORDER — PROPOFOL 10 MG/ML IV BOLUS
INTRAVENOUS | Status: DC | PRN
  Administered 2018-11-25: 150 mg via INTRAVENOUS

## 2018-11-25 MED ORDER — OXYCODONE HCL 5 MG PO TABS
ORAL_TABLET | ORAL | Status: AC
  Administered 2018-11-25: 5 mg via ORAL
  Filled 2018-11-25: qty 1

## 2018-11-25 MED ORDER — STERILE WATER FOR INJECTION IJ SOLN
INTRAMUSCULAR | Status: DC | PRN
  Administered 2018-11-25: 400 mL

## 2018-11-25 MED ORDER — FENTANYL CITRATE (PF) 250 MCG/5ML IJ SOLN
INTRAMUSCULAR | Status: AC
  Filled 2018-11-25: qty 5

## 2018-11-25 MED ORDER — IOPAMIDOL (ISOVUE-300) INJECTION 61%
INTRAVENOUS | Status: AC
  Filled 2018-11-25: qty 50

## 2018-11-25 MED ORDER — ACETAMINOPHEN 325 MG PO TABS: 650.0000 mg | ORAL_TABLET | Freq: Four times a day (QID) | ORAL | Status: DC | PRN

## 2018-11-25 MED ORDER — SODIUM CHLORIDE 0.9 % IV SOLN
INTRAVENOUS | Status: DC | PRN
  Administered 2018-11-25: 9 mL

## 2018-11-25 MED ORDER — CEFAZOLIN SODIUM-DEXTROSE 2-3 GM-%(50ML) IV SOLR
INTRAVENOUS | Status: DC | PRN
  Administered 2018-11-25: 2 g via INTRAVENOUS

## 2018-11-25 MED ORDER — KETOROLAC TROMETHAMINE 15 MG/ML IJ SOLN: 15.0000 mg | Freq: Three times a day (TID) | INTRAMUSCULAR | Status: DC | PRN

## 2018-11-25 MED ORDER — POTASSIUM CHLORIDE CRYS ER 20 MEQ PO TBCR
40.0000 meq | EXTENDED_RELEASE_TABLET | Freq: Once | ORAL | Status: AC
  Administered 2018-11-25: 40 meq via ORAL
  Filled 2018-11-25: qty 2

## 2018-11-25 MED ORDER — FENTANYL CITRATE (PF) 100 MCG/2ML IJ SOLN
25.0000 ug | INTRAMUSCULAR | Status: DC | PRN
  Administered 2018-11-25 (×2): 50 ug via INTRAVENOUS

## 2018-11-25 MED ORDER — PROPOFOL 10 MG/ML IV BOLUS
INTRAVENOUS | Status: AC
  Filled 2018-11-25: qty 20

## 2018-11-25 MED ORDER — DEXAMETHASONE SODIUM PHOSPHATE 10 MG/ML IJ SOLN
INTRAMUSCULAR | Status: AC
  Filled 2018-11-25: qty 1

## 2018-11-25 MED ORDER — DOCUSATE SODIUM 100 MG PO CAPS
100.0000 mg | ORAL_CAPSULE | Freq: Two times a day (BID) | ORAL | Status: DC
  Administered 2018-11-26: 100 mg via ORAL
  Filled 2018-11-25: qty 1

## 2018-11-25 MED ORDER — PHENYLEPHRINE 40 MCG/ML (10ML) SYRINGE FOR IV PUSH (FOR BLOOD PRESSURE SUPPORT)
PREFILLED_SYRINGE | INTRAVENOUS | Status: AC
  Filled 2018-11-25: qty 10

## 2018-11-25 MED ORDER — ENOXAPARIN SODIUM 40 MG/0.4ML ~~LOC~~ SOLN
40.0000 mg | SUBCUTANEOUS | Status: DC
  Administered 2018-11-26: 40 mg via SUBCUTANEOUS
  Filled 2018-11-25: qty 0.4

## 2018-11-25 MED ORDER — PROPOFOL 500 MG/50ML IV EMUL
INTRAVENOUS | Status: DC | PRN
  Administered 2018-11-25: 20 ug/kg/min via INTRAVENOUS

## 2018-11-25 MED ORDER — BUPIVACAINE HCL (PF) 0.25 % IJ SOLN
INTRAMUSCULAR | Status: AC
  Filled 2018-11-25: qty 30

## 2018-11-25 MED ORDER — FAMOTIDINE IN NACL 20-0.9 MG/50ML-% IV SOLN
INTRAVENOUS | Status: AC
  Administered 2018-11-25: 13:00:00
  Filled 2018-11-25: qty 50

## 2018-11-25 MED ORDER — KETOROLAC TROMETHAMINE 30 MG/ML IJ SOLN
INTRAMUSCULAR | Status: AC
  Filled 2018-11-25: qty 1

## 2018-11-25 MED ORDER — DEXAMETHASONE SODIUM PHOSPHATE 10 MG/ML IJ SOLN
INTRAMUSCULAR | Status: DC | PRN
  Administered 2018-11-25: 10 mg via INTRAVENOUS

## 2018-11-25 MED ORDER — LIDOCAINE 2% (20 MG/ML) 5 ML SYRINGE
INTRAMUSCULAR | Status: DC | PRN
  Administered 2018-11-25: 60 mg via INTRAVENOUS

## 2018-11-25 MED ORDER — ONDANSETRON HCL 4 MG/2ML IJ SOLN
INTRAMUSCULAR | Status: AC
  Filled 2018-11-25: qty 2

## 2018-11-25 MED ORDER — LIDOCAINE 2% (20 MG/ML) 5 ML SYRINGE
INTRAMUSCULAR | Status: AC
  Filled 2018-11-25: qty 5

## 2018-11-25 MED ORDER — FENTANYL CITRATE (PF) 100 MCG/2ML IJ SOLN
INTRAMUSCULAR | Status: AC
  Administered 2018-11-25: 50 ug via INTRAVENOUS
  Filled 2018-11-25: qty 2

## 2018-11-25 MED ORDER — KETOROLAC TROMETHAMINE 30 MG/ML IJ SOLN
INTRAMUSCULAR | Status: DC | PRN
  Administered 2018-11-25: 30 mg via INTRAVENOUS

## 2018-11-25 MED ORDER — OXYCODONE HCL 5 MG/5ML PO SOLN: 5.0000 mg | Freq: Once | ORAL | Status: AC | PRN

## 2018-11-25 MED ORDER — ONDANSETRON HCL 4 MG/2ML IJ SOLN: 4.0000 mg | Freq: Once | INTRAMUSCULAR | Status: DC | PRN

## 2018-11-25 MED ORDER — ROCURONIUM BROMIDE 50 MG/5ML IV SOSY
PREFILLED_SYRINGE | INTRAVENOUS | Status: DC | PRN
  Administered 2018-11-25: 50 mg via INTRAVENOUS

## 2018-11-25 MED ORDER — SODIUM CHLORIDE 0.9 % IR SOLN
Status: DC | PRN
  Administered 2018-11-25: 1000 mL

## 2018-11-25 MED ORDER — LACTATED RINGERS IV SOLN
INTRAVENOUS | Status: DC
  Administered 2018-11-25 (×3): via INTRAVENOUS

## 2018-11-25 MED ORDER — FENTANYL CITRATE (PF) 100 MCG/2ML IJ SOLN
INTRAMUSCULAR | Status: DC | PRN
  Administered 2018-11-25 (×2): 50 ug via INTRAVENOUS
  Administered 2018-11-25: 100 ug via INTRAVENOUS

## 2018-11-25 MED ORDER — PHENYLEPHRINE 40 MCG/ML (10ML) SYRINGE FOR IV PUSH (FOR BLOOD PRESSURE SUPPORT)
PREFILLED_SYRINGE | INTRAVENOUS | Status: DC | PRN
  Administered 2018-11-25: 80 ug via INTRAVENOUS
  Administered 2018-11-25: 120 ug via INTRAVENOUS

## 2018-11-25 MED ORDER — ACETAMINOPHEN 10 MG/ML IV SOLN
INTRAVENOUS | Status: DC | PRN
  Administered 2018-11-25: 1000 mg via INTRAVENOUS

## 2018-11-25 MED ORDER — CEFAZOLIN SODIUM 1 G IJ SOLR
INTRAMUSCULAR | Status: AC
  Filled 2018-11-25: qty 20

## 2018-11-25 MED ORDER — SUGAMMADEX SODIUM 200 MG/2ML IV SOLN
INTRAVENOUS | Status: DC | PRN
  Administered 2018-11-25: 100 mg via INTRAVENOUS

## 2018-11-25 MED ORDER — OXYCODONE HCL 5 MG PO TABS
5.0000 mg | ORAL_TABLET | Freq: Once | ORAL | Status: AC | PRN
  Administered 2018-11-25: 5 mg via ORAL

## 2018-11-25 MED ORDER — BUPIVACAINE HCL 0.25 % IJ SOLN
INTRAMUSCULAR | Status: DC | PRN
  Administered 2018-11-25: 30 mL

## 2018-11-25 MED ORDER — ROCURONIUM BROMIDE 50 MG/5ML IV SOSY
PREFILLED_SYRINGE | INTRAVENOUS | Status: AC
  Filled 2018-11-25: qty 5

## 2018-11-25 MED ORDER — ACETAMINOPHEN 10 MG/ML IV SOLN
INTRAVENOUS | Status: AC
  Filled 2018-11-25: qty 100

## 2018-11-25 MED ORDER — ONDANSETRON HCL 4 MG/2ML IJ SOLN
INTRAMUSCULAR | Status: DC | PRN
  Administered 2018-11-25: 4 mg via INTRAVENOUS

## 2018-11-25 SURGICAL SUPPLY — 45 items
ADH SKN CLS APL DERMABOND .7 (GAUZE/BANDAGES/DRESSINGS) ×1
APPLIER CLIP ROT 10 11.4 M/L (STAPLE) ×3
APR CLP MED LRG 11.4X10 (STAPLE) ×1
BAG SPEC RTRVL 10 TROC 200 (ENDOMECHANICALS) ×1
BLADE CLIPPER SURG (BLADE) IMPLANT
CANISTER SUCT 3000ML PPV (MISCELLANEOUS) ×3 IMPLANT
CATH CHOLANG 76X19 KUMAR (CATHETERS) ×3 IMPLANT
CHLORAPREP W/TINT 26ML (MISCELLANEOUS) ×3 IMPLANT
CLIP APPLIE ROT 10 11.4 M/L (STAPLE) IMPLANT
CLIP VESOLOCK MED LG 6/CT (CLIP) ×2 IMPLANT
COVER MAYO STAND STRL (DRAPES) ×3 IMPLANT
COVER SURGICAL LIGHT HANDLE (MISCELLANEOUS) ×3 IMPLANT
COVER WAND RF STERILE (DRAPES) ×3 IMPLANT
DERMABOND ADVANCED (GAUZE/BANDAGES/DRESSINGS) ×2
DERMABOND ADVANCED .7 DNX12 (GAUZE/BANDAGES/DRESSINGS) ×1 IMPLANT
DRAPE C-ARM 42X72 X-RAY (DRAPES) ×3 IMPLANT
ELECT REM PT RETURN 9FT ADLT (ELECTROSURGICAL) ×3
ELECTRODE REM PT RTRN 9FT ADLT (ELECTROSURGICAL) ×1 IMPLANT
GLOVE BIOGEL PI IND STRL 7.0 (GLOVE) ×1 IMPLANT
GLOVE BIOGEL PI INDICATOR 7.0 (GLOVE) ×2
GLOVE SURG SS PI 7.0 STRL IVOR (GLOVE) ×3 IMPLANT
GOWN STRL REUS W/ TWL LRG LVL3 (GOWN DISPOSABLE) ×3 IMPLANT
GOWN STRL REUS W/TWL LRG LVL3 (GOWN DISPOSABLE) ×9
GRASPER SUT TROCAR 14GX15 (MISCELLANEOUS) ×3 IMPLANT
IV CATH 14GX2 1/4 (CATHETERS) ×2 IMPLANT
KIT BASIN OR (CUSTOM PROCEDURE TRAY) ×3 IMPLANT
KIT TURNOVER KIT B (KITS) ×3 IMPLANT
NEEDLE 22X1 1/2 (OR ONLY) (NEEDLE) ×3 IMPLANT
NS IRRIG 1000ML POUR BTL (IV SOLUTION) ×3 IMPLANT
PAD ARMBOARD 7.5X6 YLW CONV (MISCELLANEOUS) ×3 IMPLANT
POUCH RETRIEVAL ECOSAC 10 (ENDOMECHANICALS) ×1 IMPLANT
POUCH RETRIEVAL ECOSAC 10MM (ENDOMECHANICALS) ×2
SCISSORS LAP 5X35 DISP (ENDOMECHANICALS) ×3 IMPLANT
SET IRRIG TUBING LAPAROSCOPIC (IRRIGATION / IRRIGATOR) ×3 IMPLANT
SET TUBE SMOKE EVAC HIGH FLOW (TUBING) ×3 IMPLANT
SLEEVE ENDOPATH XCEL 5M (ENDOMECHANICALS) ×6 IMPLANT
SPECIMEN JAR SMALL (MISCELLANEOUS) ×3 IMPLANT
STOPCOCK 4 WAY LG BORE MALE ST (IV SETS) ×3 IMPLANT
SUT MNCRL AB 4-0 PS2 18 (SUTURE) ×5 IMPLANT
TOWEL OR 17X24 6PK STRL BLUE (TOWEL DISPOSABLE) ×3 IMPLANT
TOWEL OR 17X26 10 PK STRL BLUE (TOWEL DISPOSABLE) ×3 IMPLANT
TRAY LAPAROSCOPIC MC (CUSTOM PROCEDURE TRAY) ×3 IMPLANT
TROCAR XCEL 12X100 BLDLESS (ENDOMECHANICALS) ×3 IMPLANT
TROCAR XCEL NON-BLD 5MMX100MML (ENDOMECHANICALS) ×3 IMPLANT
WATER STERILE IRR 1000ML POUR (IV SOLUTION) ×3 IMPLANT

## 2018-11-25 NOTE — Anesthesia Postprocedure Evaluation (Signed)
Anesthesia Post Note  Patient: Joy Mosley  Procedure(s) Performed: LAPAROSCOPIC CHOLECYSTECTOMY WITH  INTRAOPERATIVE CHOLANGIOGRAM (N/A Abdomen)     Patient location during evaluation: PACU Anesthesia Type: General Level of consciousness: awake and alert Pain management: pain level controlled Vital Signs Assessment: post-procedure vital signs reviewed and stable Respiratory status: spontaneous breathing, nonlabored ventilation, respiratory function stable and patient connected to nasal cannula oxygen Cardiovascular status: blood pressure returned to baseline and stable Postop Assessment: no apparent nausea or vomiting Anesthetic complications: no    Last Vitals:  Vitals:   11/25/18 1445 11/25/18 1502  BP: 124/69 125/66  Pulse: 71 68  Resp: 12 12  Temp: 36.5 C 36.6 C  SpO2: 95% 96%    Last Pain:  Vitals:   11/25/18 1502  TempSrc: Oral  PainSc:                  Ryan P Ellender

## 2018-11-25 NOTE — Anesthesia Procedure Notes (Signed)
Procedure Name: Intubation Date/Time: 11/25/2018 1:05 PM Performed by: Trinna Post., CRNA Pre-anesthesia Checklist: Patient identified, Emergency Drugs available, Suction available, Patient being monitored and Timeout performed Patient Re-evaluated:Patient Re-evaluated prior to induction Oxygen Delivery Method: Circle system utilized Preoxygenation: Pre-oxygenation with 100% oxygen Induction Type: IV induction Ventilation: Mask ventilation without difficulty Laryngoscope Size: Mac and 3 Grade View: Grade II Tube type: Oral Tube size: 7.0 mm Number of attempts: 1 Airway Equipment and Method: Stylet Placement Confirmation: ETT inserted through vocal cords under direct vision,  positive ETCO2 and breath sounds checked- equal and bilateral Secured at: 22 cm Tube secured with: Tape Dental Injury: Teeth and Oropharynx as per pre-operative assessment

## 2018-11-25 NOTE — Care Management Important Message (Signed)
Important Message  Patient Details  Name: Joy Mosley MRN: [id number] Date of Birth: 01/16/1951   Medicare Important Message Given:  Yes    Mackenzie Lia Stefan ChurchBratton 11/25/2018, 8:54 AM

## 2018-11-25 NOTE — Progress Notes (Signed)
  Progress Note: General Surgery Service   Assessment/Plan: Active Problems:   Acute pancreatitis  s/p Procedure(s): LAPAROSCOPIC CHOLECYSTECTOMY WITH POSSIBLE INTRAOPERATIVE CHOLANGIOGRAM 11/25/2018 68 yo female with abdominal pain and signs of pancreatitis, abdominal pain improved, fluid status improved -OR today -We discussed the etiology of her pain, we discussed treatment options and recommended surgery. We discussed details of surgery including general anesthesia, laparoscopic approach, identification of cystic duct and common bile duct. Ligation of cystic duct and cystic artery. Possible need for intraoperative cholangiogram or open procedure. Possible risks of common bile duct injury, liver injury, cystic duct leak, bleeding, infection, post-cholecystectomy syndrome. The patient showed good understanding and all questions were answered    LOS: 5 days  Chief Complaint/Subjective: No problems overnight, breathing better  Objective: Vital signs in last 24 hours: Temp:  [98.2 F (36.8 C)] 98.2 F (36.8 C) (02/03 2210) Pulse Rate:  [92] 92 (02/03 2210) Resp:  [18] 18 (02/03 2210) BP: (110)/(72) 110/72 (02/03 2210) SpO2:  [91 %] 91 % (02/03 2210) Weight:  [78.5 kg] 78.5 kg (02/04 0500) Last BM Date: 11/24/18  Intake/Output from previous day: 02/03 0701 - 02/04 0700 In: 550 [P.O.:550] Out: -  Intake/Output this shift: No intake/output data recorded.  Lungs: nonlabored breathing  Cardiovascular: RRR  Abd: soft, slight pain epigastrium  Extremities: no edema  Neuro: AOx4  Lab Results: CBC  Recent Labs    11/24/18 0248 11/25/18 0338  WBC 9.1 9.0  HGB 11.2* 12.8  HCT 34.1* 37.8  PLT 244 302   BMET Recent Labs    11/24/18 0248 11/25/18 0338  NA 140 139  K 3.3* 3.3*  CL 103 102  CO2 26 30  GLUCOSE 100* 104*  BUN 7* 9  CREATININE 0.66 0.79  CALCIUM 7.9* 8.3*   PT/INR No results for input(s): LABPROT, INR in the last 72 hours. ABG No results for  input(s): PHART, HCO3 in the last 72 hours.  Invalid input(s): PCO2, PO2  Studies/Results:  Anti-infectives: Anti-infectives (From admission, onward)   None      Medications: Scheduled Meds: . enoxaparin (LOVENOX) injection  40 mg Subcutaneous Q24H  . feeding supplement (GLUCERNA SHAKE)  237 mL Oral TID BM  . polyethylene glycol  17 g Oral Daily  . sodium chloride flush  3 mL Intravenous Once   Continuous Infusions: PRN Meds:.HYDROmorphone (DILAUDID) injection, ipratropium-albuterol, ondansetron (ZOFRAN) IV, oxyCODONE  Rodman Pickle, MD [facility]

## 2018-11-25 NOTE — Progress Notes (Signed)
PROGRESS NOTE    Joy Mosley  SPQ:[id number] DOB: 11-17-1950 DOA: 11/19/2018 PCP: Sheliah Hatch, MD   Brief Narrative:  Joy Mosley is Joy Mosley 68 y.o. female with medical history significant of diverticulitis presenting to the hospital for evaluation of abdominal pain.  Patient states she ate lunch at noon 1/29 and then took Kilani Joffe nap.  She woke up from her nap within 30 minutes with acute onset severe epigastric pain radiating to her back.  She describes the pain as squeezing in character.  Also reports having associated nausea and vomiting.  Denies having any fevers or chills.  Denies having any chest pain.  States she drinks alcohol only socially; had an [place] coffee Sevag Shearn week ago.  Assessment & Plan:   Active Problems:   Acute pancreatitis  Acute Pancreatitis: No meds, rare etoh, normal triglycerides. Gallstone pancreatitis suspected given [place] (interestingly, MRI did not show gallstones, discussed this with radiology who noted gallstones on [place], but did note that it was unusual MRI was not showing stones). S/p lap cholecystectomy with IOC on 2/4 Dilaudid, zofran for pain/nausea Strict I/O, daily weights GI c/s, appreciate recs Possible d/c tomorrow  Acute Hypoxic Respiratory Failure  Dyspnea:  CXR with bibasilar atelectasis vs edema with mild pleural effusions on 2/2.  SOB improved at this point in time. Improved with lasix EF 60-65% with mildly elevated RVSP (would recommend outpatient follow up for this) Continue IS, flutter Currently on RA  Cough  Bilateral Small Pleural Effusions:   As noted above  Elevated LFT's: Likely related to above, improving.  Negative acute hepatitis panel.   Leukocytosis: afebrile, improved.  Continue to monitor.   NAGMA:  Improved, follow  Hypokalemia: replace and follow  DVT prophylaxis: lovenox Code Status: full Family Communication: none at bedside Disposition Plan: hopefully tomorrow on POD1  Consultants:    GI  Surgery  Procedures:   none  Antimicrobials:  Anti-infectives (From admission, onward)   None         Subjective: Feeling better today, asking when surgery will be  Objective: Vitals:   11/25/18 1156 11/25/18 1416 11/25/18 1431 11/25/18 1445  BP: 134/73 117/65 116/63 124/69  Pulse: 85 85 74 71  Resp: 16 (!) 22 12 12   Temp: 98.5 F (36.9 C) 97.7 F (36.5 C)  97.7 F (36.5 C)  TempSrc: Oral     SpO2: 94% 93% 95% 95%  Weight:      Height:        Intake/Output Summary (Last 24 hours) at 11/25/2018 1446 Last data filed at 11/25/2018 1446 Gross per 24 hour  Intake 1520 ml  Output 15 ml  Net 1505 ml   Filed Weights   11/22/18 0527 11/23/18 0500 11/25/18 0500  Weight: 86.1 kg 86.1 kg 78.5 kg    Examination:  General: No acute distress. Cardiovascular: Heart sounds show Saeed Toren regular rate, and rhythm Lungs: Clear to auscultation bilaterally Abdomen: Soft, TTP in epigastric and LUQ Neurological: Alert and oriented 3. Moves all extremities 4. Cranial nerves II through XII grossly intact. Skin: Warm and dry. No rashes or lesions. Extremities: No clubbing or cyanosis. No edema.  Psychiatric: Mood and affect are normal. Insight and judgment are appropriate.   Data Reviewed: I have personally reviewed following labs and imaging studies  CBC: Recent Labs  Lab 11/19/18 1500 11/22/18 0255 11/23/18 0415 11/24/18 0248 11/25/18 0338  WBC 7.3 14.3* 12.2* 9.1 9.0  HGB 13.4 12.1 11.4* 11.2* 12.8  HCT 41.4 36.9 34.1* 34.1* 37.8  MCV 91.2 90.0 90.7 89.7 87.5  PLT 273 239 233 244 302   Basic Metabolic Panel: Recent Labs  Lab 11/21/18 0114 11/22/18 0255 11/23/18 0415 11/24/18 0248 11/25/18 0338  NA 142 137 138 140 139  K 3.7 3.6 3.4* 3.3* 3.3*  CL 113* 106 105 103 102  CO2 22 19* 21* 26 30  GLUCOSE 96 92 86 100* 104*  BUN 7* 5* 6* 7* 9  CREATININE 0.74 0.61 0.64 0.66 0.79  CALCIUM 8.0* 8.1* 7.9* 7.9* 8.3*  MG  --  1.7 1.7 1.8  --    GFR: Estimated  Creatinine Clearance: 67.3 mL/min (by C-G formula based on SCr of 0.79 mg/dL). Liver Function Tests: Recent Labs  Lab 11/21/18 0114 11/22/18 0255 11/23/18 0415 11/24/18 0248 11/25/18 0338  AST 47* 40 28 52* 30  ALT 81* 58* 47* 55* 44  ALKPHOS 58 67 78 137* 122  BILITOT 1.1 1.8* 1.5* 1.0 1.3*  PROT 5.4* 5.6* 5.2* 5.2* 5.8*  ALBUMIN 3.0* 2.9* 2.5* 2.4* 2.7*   Recent Labs  Lab 11/20/18 0558 11/22/18 0255 11/23/18 0415 11/24/18 0248 11/25/18 0338  LIPASE 1,007* 40 20 170* 26   No results for input(s): AMMONIA in the last 168 hours. Coagulation Profile: No results for input(s): INR, PROTIME in the last 168 hours. Cardiac Enzymes: No results for input(s): CKTOTAL, CKMB, CKMBINDEX, TROPONINI in the last 168 hours. BNP (last 3 results) No results for input(s): PROBNP in the last 8760 hours. HbA1C: No results for input(s): HGBA1C in the last 72 hours. CBG: No results for input(s): GLUCAP in the last 168 hours. Lipid Profile: No results for input(s): CHOL, HDL, LDLCALC, TRIG, CHOLHDL, LDLDIRECT in the last 72 hours. Thyroid Function Tests: No results for input(s): TSH, T4TOTAL, FREET4, T3FREE, THYROIDAB in the last 72 hours. Anemia Panel: No results for input(s): VITAMINB12, FOLATE, FERRITIN, TIBC, IRON, RETICCTPCT in the last 72 hours. Sepsis Labs: No results for input(s): PROCALCITON, LATICACIDVEN in the last 168 hours.  Recent Results (from the past 240 hour(s))  Surgical pcr screen     Status: None   Collection Time: 11/23/18  7:54 PM  Result Value Ref Range Status   MRSA, PCR NEGATIVE NEGATIVE Final   Staphylococcus aureus NEGATIVE NEGATIVE Final    Comment: (NOTE) The Xpert SA Assay (FDA approved for NASAL specimens in patients 33 years of age and older), is one component of Roderick Calo comprehensive surveillance program. It is not intended to diagnose infection nor to guide or monitor treatment. Performed at [facility], [address] [place], [place] [address]           Radiology Studies: Dg Cholangiogram Operative  Result Date: 11/25/2018 CLINICAL DATA:  68 year old female undergoing elective laparoscopic cholecystectomy EXAM: INTRAOPERATIVE CHOLANGIOGRAM TECHNIQUE: Cholangiographic images from the C-arm fluoroscopic device were submitted for interpretation post-operatively. Please see the procedural report for the amount of contrast and the fluoroscopy time utilized. COMPARISON:  MRCP 11/20/2018 FINDINGS: Telia Amundson single cine clip is submitted for review. The images demonstrate cannulation of the cystic duct remanent and opacification of the biliary tree. No evidence of biliary ductal dilatation, stenosis, stricture or choledocholithiasis. Contrast passes freely through the ampulla and into the duodenum. IMPRESSION: Negative intraoperative cholangiogram. Electronically Signed   By: Malachy Moan M.D.   On: 11/25/2018 14:30        Scheduled Meds: . [MAR Hold] enoxaparin (LOVENOX) injection  40 mg Subcutaneous Q24H  . [MAR Hold] feeding supplement (GLUCERNA SHAKE)  237 mL Oral TID BM  . [  MAR Hold] polyethylene glycol  17 g Oral Daily  . [MAR Hold] sodium chloride flush  3 mL Intravenous Once   Continuous Infusions: . lactated ringers 10 mL/hr at 11/25/18 1235     LOS: 5 days    Time spent: over 30 min    Lacretia Nicksaldwell Powell, MD [facility] Pager AMION  If 7PM-7AM, please contact night-coverage [email or web address] Password [email or web address] 11/25/2018, 2:46 PM

## 2018-11-25 NOTE — Progress Notes (Signed)
RN verified the presence of a signed informed consent that matches stated procedure by patient. Verified armband matches patient's stated name and birth date. Verified NPO status and that all jewelry, contact, glasses, dentures, and partials had been removed (if applicable).  

## 2018-11-25 NOTE — Op Note (Signed)
PATIENT:  Joy Mosley  68 y.o. female  PRE-OPERATIVE DIAGNOSIS:  GALLSTONE PANCREATITIS  POST-OPERATIVE DIAGNOSIS:  GALLSTONE PANCREATITIS  PROCEDURE:  Procedure(s): LAPAROSCOPIC CHOLECYSTECTOMY WITH  INTRAOPERATIVE CHOLANGIOGRAM   SURGEON:  Surgeon(s): Haris Baack, De Blanch, MD  ASSISTANT: Leary Roca  ANESTHESIA:   local and general  Indications for procedure: [name] is a 68 y.o. female with symptoms of Abdominal pain and Nausea and vomiting consistent with gallbladder disease, Confirmed by Ultrasound.  Description of procedure: The patient was brought into the operative suite, placed supine. Anesthesia was administered with endotracheal tube. Patient was strapped in place and foot board was secured. All pressure points were offloaded by foam padding. The patient was prepped and draped in the usual sterile fashion.  A small incision was made to the right of the umbilicus. A 84mm trocar was inserted into the peritoneal cavity with optical entry. Pneumoperitoneum was applied with high flow low pressure. 2 65mm trocars were placed in the RUQ. A 22mm trocar was placed in the subxiphoid space. Marcaine was infused to the subxiphoid space and lateral upper right abdomen in the transversus abdominis plane. Next the patient was placed in reverse trendelenberg. The gallbladder was white in color with some adhesions of the omentum to the liver and gallbladder. These were removed with cautery.  The gallbladder was retracted cephalad and lateral. The peritoneum was reflected off the infundibulum working lateral to medial. The cystic duct and cystic artery were identified and further dissection revealed a critical view, due to concern for choledocholithiasis a cholangiogram was performed with ductotomy and cook catheter passed through a separate subcostal stab incision. Normal ductal anatomy was seen without filling defects. The cystic duct and cystic artery were doubly clipped and ligated.    The gallbladder was removed off the liver bed with cautery. The Gallbladder was placed in a specimen bag. The gallbladder fossa was irrigated and hemostasis was applied with cautery. The gallbladder was removed via the 48mm trocar. No dilation was required for removal, therefore no fascial closure was performed. Pneumoperitoneum was removed, all trocar were removed. All incisions were closed with 4-0 monocryl subcuticular stitch. The patient woke from anesthesia and was brought to PACU in stable condition. All counts were correct  Findings: normal ductal anatomy, chronic cholecystitis  Specimen: gallbladder  Blood loss: 30 ml  Local anesthesia: 30 ml marcaine  Complications: none  PLAN OF CARE: Admit to inpatient   PATIENT DISPOSITION:  PACU - hemodynamically stable.  Feliciana Rossetti, M.D. General, Bariatric, & Minimally Invasive Surgery [facility]

## 2018-11-25 NOTE — Transfer of Care (Signed)
Immediate Anesthesia Transfer of Care Note  Patient: Joy Mosley  Procedure(s) Performed: LAPAROSCOPIC CHOLECYSTECTOMY WITH  INTRAOPERATIVE CHOLANGIOGRAM (N/A Abdomen)  Patient Location: PACU  Anesthesia Type:General  Level of Consciousness: awake, alert  and oriented  Airway & Oxygen Therapy: Patient Spontanous Breathing and Patient connected to nasal cannula oxygen  Post-op Assessment: Report given to RN and Post -op Vital signs reviewed and stable  Post vital signs: Reviewed and stable  Last Vitals:  Vitals Value Taken Time  BP 117/65 11/25/2018  2:16 PM  Temp 36.5 C 11/25/2018  2:16 PM  Pulse 78 11/25/2018  2:19 PM  Resp 9 11/25/2018  2:19 PM  SpO2 94 % 11/25/2018  2:19 PM  Vitals shown include unvalidated device data.  Last Pain:  Vitals:   11/25/18 1156  TempSrc: Oral  PainSc:       Patients Stated Pain Goal: 4 (11/21/18 1952)  Complications: No apparent anesthesia complications

## 2018-11-25 NOTE — Anesthesia Preprocedure Evaluation (Addendum)
Anesthesia Evaluation  Patient identified by MRN, date of birth, ID band Patient awake    Reviewed: Allergy & Precautions, NPO status , Patient's Chart, lab work & pertinent test results  History of Anesthesia Complications Negative for: history of anesthetic complications  Airway Mallampati: III  TM Distance: >3 FB Neck ROM: Full  Mouth opening: Limited Mouth Opening  Dental no notable dental hx.    Pulmonary neg pulmonary ROS,    Pulmonary exam normal breath sounds clear to auscultation       Cardiovascular negative cardio ROS Normal cardiovascular exam Rhythm:Regular Rate:Normal     Neuro/Psych negative neurological ROS  negative psych ROS   GI/Hepatic Neg liver ROS,  Gallstone pancreatitis    Endo/Other  negative endocrine ROS  Renal/GU negative Renal ROS     Musculoskeletal negative musculoskeletal ROS (+)   Abdominal   Peds  Hematology negative hematology ROS (+)   Anesthesia Other Findings GALLSTONE PANCREATITIS  Reproductive/Obstetrics                            Anesthesia Physical Anesthesia Plan  ASA: II  Anesthesia Plan: General   Post-op Pain Management:    Induction: Intravenous  PONV Risk Score and Plan: 4 or greater and Treatment may vary due to age or medical condition, Ondansetron, Dexamethasone, Midazolam and Propofol infusion  Airway Management Planned: Oral ETT  Additional Equipment: None  Intra-op Plan:   Post-operative Plan: Extubation in OR  Informed Consent: I have reviewed the patients History and Physical, chart, labs and discussed the procedure including the risks, benefits and alternatives for the proposed anesthesia with the patient or authorized representative who has indicated his/her understanding and acceptance.     Dental advisory given  Plan Discussed with: CRNA and Surgeon  Anesthesia Plan Comments:       Anesthesia Quick  Evaluation

## 2018-11-25 NOTE — Care Management Important Message (Signed)
Important Message  Patient Details  Name: Joy Mosley MRN: [id number] Date of Birth: [birth year]   Medicare Important Message Given:  Yes    Abdulai Blaylock Stefan Church 11/25/2018, 8:58 AM

## 2018-11-26 ENCOUNTER — Encounter (HOSPITAL_COMMUNITY): Payer: Self-pay | Admitting: General Surgery

## 2018-11-26 DIAGNOSIS — R05 Cough: Secondary | ICD-10-CM

## 2018-11-26 DIAGNOSIS — E876 Hypokalemia: Secondary | ICD-10-CM

## 2018-11-26 DIAGNOSIS — D72829 Elevated white blood cell count, unspecified: Secondary | ICD-10-CM

## 2018-11-26 DIAGNOSIS — J90 Pleural effusion, not elsewhere classified: Secondary | ICD-10-CM

## 2018-11-26 DIAGNOSIS — R945 Abnormal results of liver function studies: Secondary | ICD-10-CM

## 2018-11-26 LAB — COMPREHENSIVE METABOLIC PANEL
ALT: 49 U/L — ABNORMAL HIGH (ref 0–44)
AST: 51 U/L — ABNORMAL HIGH (ref 15–41)
Albumin: 2.3 g/dL — ABNORMAL LOW (ref 3.5–5.0)
Alkaline Phosphatase: 102 U/L (ref 38–126)
Anion gap: 11 (ref 5–15)
BUN: 11 mg/dL (ref 8–23)
CALCIUM: 8.1 mg/dL — AB (ref 8.9–10.3)
CO2: 25 mmol/L (ref 22–32)
Chloride: 104 mmol/L (ref 98–111)
Creatinine, Ser: 0.74 mg/dL (ref 0.44–1.00)
GFR calc Af Amer: >60 mL/min (ref >60)
Glucose, Bld: 108 mg/dL — ABNORMAL HIGH (ref 70–99)
Potassium: 4.2 mmol/L (ref 3.5–5.1)
Sodium: 140 mmol/L (ref 135–145)
TOTAL PROTEIN: 5.3 g/dL — AB (ref 6.5–8.1)
Total Bilirubin: 0.7 mg/dL (ref 0.3–1.2)

## 2018-11-26 LAB — CBC
HCT: 34.9 % — ABNORMAL LOW (ref 36.0–46.0)
HEMOGLOBIN: 11.5 g/dL — AB (ref 12.0–15.0)
MCH: 29.4 pg (ref 26.0–34.0)
MCHC: 33 g/dL (ref 30.0–36.0)
MCV: 89.3 fL (ref 80.0–100.0)
Platelets: 283 10*3/uL (ref 150–400)
RBC: 3.91 MIL/uL (ref 3.87–5.11)
RDW: 13 % (ref 11.5–15.5)
WBC: 9.8 10*3/uL (ref 4.0–10.5)
nRBC: 0 % (ref 0.0–0.2)

## 2018-11-26 LAB — MAGNESIUM: MAGNESIUM: 1.9 mg/dL (ref 1.7–2.4)

## 2018-11-26 MED ORDER — GLUCERNA SHAKE PO LIQD: 237.0000 mL | Freq: Three times a day (TID) | ORAL | 0 refills | Status: DC

## 2018-11-26 MED ORDER — POLYETHYLENE GLYCOL 3350 17 G PO PACK: 17.0000 g | PACK | Freq: Every day | ORAL | 0 refills | Status: DC

## 2018-11-26 MED ORDER — OXYCODONE HCL 5 MG PO TABS: 5.0000 mg | ORAL_TABLET | Freq: Four times a day (QID) | ORAL | 0 refills | Status: DC | PRN

## 2018-11-26 MED ORDER — DOCUSATE SODIUM 100 MG PO CAPS: 100.0000 mg | ORAL_CAPSULE | Freq: Two times a day (BID) | ORAL | 0 refills | Status: DC

## 2018-11-26 NOTE — Discharge Summary (Signed)
Physician Discharge Summary  Joy Mosley ZOX:[id number] DOB: 01/07/1951 DOA: 11/19/2018  PCP: Sheliah Hatchabori, Katherine E, MD  Admit date: 11/19/2018 Discharge date: 11/26/2018  Time spent: 45 minutes  Recommendations for Outpatient Follow-up:  Patient will be discharged to home.  Patient will need to follow up with primary care provider within one week of discharge, repeat CMP.  Follow up with general surgery. Patient should continue medications as prescribed.  Patient should follow a soft diet.   Discharge Diagnoses:  Active Problems: Acute gallstone pancreatitis Acute hypoxic respiratory failure/dyspnea Cough/bilateral small pleural effusions Elevated LFTs Leukocytosis Hypokalemia NAGMA  Discharge Condition: Stable  Diet recommendation: soft  Filed Weights   11/23/18 0500 11/25/18 0500 11/26/18 0437  Weight: 86.1 kg 78.5 kg 81.9 kg    History of present illness:  On 11/20/2018 by Dr. John GiovanniVasundhra Rathore Joy Mosley is a 68 y.o. female with medical history significant of diverticulitis presenting to the hospital for evaluation of abdominal pain.  Patient states she ate lunch at noon 1/29 and then took a nap.  She woke up from her nap within 30 minutes with acute onset severe epigastric pain radiating to her back.  She describes the pain as squeezing in character.  Also reports having associated nausea and vomiting.  Denies having any fevers or chills.  Denies having any chest pain.  States she drinks alcohol only socially; had an [place] coffee a week ago.  Hospital Course:  Gallstone pancreatitis -Presented with abdominal pain -RUQ US: Cholelithiasis.  No other acute finding by ultrasound -MRCP showed findings compatible with acute pancreatitis.  No pancreatic necrosis or pseudocyst identified.  No gallstones, biliary ductal dilatation or choledocholithiasis -Gastroenterology, general surgery consulted and appreciated -s/p laparoscopic cholecystectomy with intraoperative  cholangiogram -Patient to follow-up with general surgery  Acute hypoxic respiratory failure/dyspnea -Chest x-ray reviewed, showing bibasilar atelectasis versus edema with mild pleural effusions on 11/23/2018 -Shortness of breath is improved -Patient was given Lasix -Echocardiogram showed an EF of 6065%, mildly elevated RVSP (patient should follow-up with pulmonology or cardiology for this on discharge) -Continue incentive spirometer and flutter -Patient is on room air  Cough/bilateral small pleural effusions -As noted above, stable  Elevated LFTs -Hepatitis panel unremarkable -likely secondary to the above -Repeat CMP in one week  Leukocytosis -resolved -patient afebrile  Hypokalemia -resolved  NAGMA -resolved  Procedures: RUQ US Echocardiogram MRCP  Consultations: Gastroenterology General surgery   Discharge Exam: Vitals:   11/26/18 0224 11/26/18 0633  BP: (!) 97/55 117/81  Pulse: 73 80  Resp: 18 19  Temp: 98.7 F (37.1 C) 98.4 F (36.9 C)  SpO2: 93% 95%     General: Well developed, well nourished, NAD, appears stated age  HEENT: NCAT, PERRLA, EOMI, Anicteic Sclera, mucous membranes moist.  Neck: Supple, no JVD, no masses  Cardiovascular: S1 S2 auscultated, no rubs, murmurs or gallops. Regular rate and rhythm.  Respiratory: Clear to auscultation bilaterally with equal chest rise  Abdomen: Soft, nontender, nondistended, + bowel sounds  Extremities: warm dry without cyanosis clubbing or edema  Neuro: AAOx3, cranial nerves grossly intact. Strength 5/5 in patient's upper and lower extremities bilaterally  Skin: Without rashes exudates or nodules  Psych: Normal affect and demeanor with intact judgement and insight  Discharge Instructions Discharge Instructions    Discharge instructions   Complete by:  As directed    Patient will be discharged to home.  Patient will need to follow up with primary care provider within one week of discharge, repeat  CMP.  Follow  up with general surgery. Patient should continue medications as prescribed.  Patient should follow a soft diet.     Allergies as of 11/26/2018   No Known Allergies     Medication List    STOP taking these medications   aspirin-acetaminophen-caffeine 250-250-65 MG tablet Commonly known as:  EXCEDRIN MIGRAINE     TAKE these medications   docusate sodium 100 MG capsule Commonly known as:  COLACE Take 1 capsule (100 mg total) by mouth 2 (two) times daily.   feeding supplement (GLUCERNA SHAKE) Liqd Take 237 mLs by mouth 3 (three) times daily between meals.   oxyCODONE 5 MG immediate release tablet Commonly known as:  Oxy IR/ROXICODONE Take 1 tablet (5 mg total) by mouth every 6 (six) hours as needed for severe pain.   polyethylene glycol packet Commonly known as:  MIRALAX / GLYCOLAX Take 17 g by mouth daily. Start taking on:  November 27, 2018      No Known Allergies Follow-up Information    [facility], [place]. Go on 12/09/2018.   Specialty:  General Surgery Why:  02/18 at 11:15 am. Please arrive 20 minutes prior to complete paperwork. please bring photo ID and insurance card.  Contact information: [address] [place] [address] [phone]       Sheliah Hatch, MD. Schedule an appointment as soon as possible for a visit in 1 week(s).   Specialty:  Family Medicine Why:  Hospital follow up Contact information: [address] [place] [address] [phone]            The results of significant diagnostics from this hospitalization (including imaging, microbiology, ancillary and laboratory) are listed below for reference.    Significant Diagnostic Studies: Dg Chest 2 View  Result Date: 11/23/2018 CLINICAL DATA:  Abnormal chest x-ray. EXAM: CHEST - 2 VIEW COMPARISON:  Radiograph November 21, 2018. FINDINGS: Stable cardiomediastinal silhouette. No pneumothorax is noted. Stable bibasilar  atelectasis or edema is noted with associated pleural effusions. Bony thorax is unremarkable. IMPRESSION: Stable bibasilar atelectasis or edema is noted with mild pleural effusions. Electronically Signed   By: Lupita Raider, M.D.   On: 11/23/2018 07:53   Dg Chest 2 View  Result Date: 11/19/2018 CLINICAL DATA:  Bilateral breast pain EXAM: CHEST - 2 VIEW COMPARISON:  08/08/2014 FINDINGS: Cardiac shadow is within normal limits. Lungs are well aerated bilaterally. No focal infiltrate or sizable effusion is seen. No bony abnormality is noted. IMPRESSION: No acute abnormality noted. Electronically Signed   By: Alcide Clever M.D.   On: 11/19/2018 15:49   Dg Cholangiogram Operative  Result Date: 11/25/2018 CLINICAL DATA:  68 year old female undergoing elective laparoscopic cholecystectomy EXAM: INTRAOPERATIVE CHOLANGIOGRAM TECHNIQUE: Cholangiographic images from the C-arm fluoroscopic device were submitted for interpretation post-operatively. Please see the procedural report for the amount of contrast and the fluoroscopy time utilized. COMPARISON:  MRCP 11/20/2018 FINDINGS: A single cine clip is submitted for review. The images demonstrate cannulation of the cystic duct remanent and opacification of the biliary tree. No evidence of biliary ductal dilatation, stenosis, stricture or choledocholithiasis. Contrast passes freely through the ampulla and into the duodenum. IMPRESSION: Negative intraoperative cholangiogram. Electronically Signed   By: Malachy Moan M.D.   On: 11/25/2018 14:30   Dg Chest Port 1 View  Result Date: 11/21/2018 CLINICAL DATA:  Cough. EXAM: PORTABLE CHEST 1 VIEW COMPARISON:  Chest x-ray dated November 19, 2018. FINDINGS: The heart size and mediastinal contours are within normal  limits. Normal pulmonary vascularity. New small bilateral pleural effusions with bibasilar atelectasis. No pneumothorax. No acute osseous abnormality. IMPRESSION: 1. New small bilateral pleural effusions with  bibasilar atelectasis. Electronically Signed   By: Obie Dredge M.D.   On: 11/21/2018 11:57   Mr Abdomen Mrcp Vivien Rossetti Contast  Result Date: 11/20/2018 CLINICAL DATA:  Abdominal pain. EXAM: MRI ABDOMEN WITHOUT AND WITH CONTRAST (INCLUDING MRCP) TECHNIQUE: Multiplanar multisequence MR imaging of the abdomen was performed both before and after the administration of intravenous contrast. Heavily T2-weighted images of the biliary and pancreatic ducts were obtained, and three-dimensional MRCP images were rendered by post processing. CONTRAST:  7.5 cc Gadavist COMPARISON:  Ultrasound 11/19/2018 FINDINGS: Lower chest: Small pleural effusions noted right greater than left. Hepatobiliary: No focal liver abnormality. The gallbladder appears within normal limits. No gallstones or gallbladder sludge. No common bile duct dilatation. No evidence for choledocholithiasis. Pancreas: There is diffuse the edema of the pancreas with surrounding soft tissue stranding and free fluid compatible with acute pancreatitis. No pancreatic necrosis main duct dilatation or mass. Spleen:  Within normal limits in size and appearance. Adrenals/Urinary Tract: Normal appearance of the adrenal glands. The kidneys are unremarkable. No mass or hydronephrosis visualized. Stomach/Bowel: Stomach nondistended. Diverticulum identified arising from the transverse portion of the duodenum. No abnormal dilatation of the small or large bowel loops. Vascular/Lymphatic: Normal appearance of the abdominal aorta. The portal vein and hepatic veins appear patent. Other: There is a moderate to large volume of free fluid identified within the abdomen. Musculoskeletal: No suspicious bone lesions identified. IMPRESSION: 1. Imaging findings compatible with acute pancreatitis. No pancreatic necrosis or pseudocyst identified at this time. 2. No gallstones, biliary ductal dilatation or choledocholithiasis. Electronically Signed   By: Signa Kell M.D.   On: 11/20/2018  07:29   US Abdomen Limited Ruq  Result Date: 11/19/2018 CLINICAL DATA:  Right upper quadrant pain EXAM: ULTRASOUND ABDOMEN LIMITED RIGHT UPPER QUADRANT COMPARISON:  None. FINDINGS: Gallbladder: Numerous small echogenic shadowing gallstones. Normal wall thickness measuring 2 mm. No Murphy's sign. Gallstones measure 5 mm or less in size. Common bile duct: Diameter: 3.3 mm Liver: Mild heterogeneous echotexture without focal abnormality. No biliary dilatation. No surrounding free fluid or ascites. Portal vein is patent on color Doppler imaging with normal direction of blood flow towards the liver. IMPRESSION: Cholelithiasis.  No other acute finding by ultrasound. Electronically Signed   By: Judie Petit.  Shick M.D.   On: 11/19/2018 20:10    Microbiology: Recent Results (from the past 240 hour(s))  Surgical pcr screen     Status: None   Collection Time: 11/23/18  7:54 PM  Result Value Ref Range Status   MRSA, PCR NEGATIVE NEGATIVE Final   Staphylococcus aureus NEGATIVE NEGATIVE Final    Comment: (NOTE) The Xpert SA Assay (FDA approved for NASAL specimens in patients 42 years of age and older), is one component of a comprehensive surveillance program. It is not intended to diagnose infection nor to guide or monitor treatment. Performed at [facility], [address], [place], [place] [address]      Labs: Basic Metabolic Panel: Recent Labs  Lab 11/22/18 0255 11/23/18 0415 11/24/18 0248 11/25/18 0338 11/26/18 0409  NA 137 138 140 139 140  K 3.6 3.4* 3.3* 3.3* 4.2  CL 106 105 103 102 104  CO2 19* 21* 26 30 25   GLUCOSE 92 86 100* 104* 108*  BUN 5* 6* 7* 9 11  CREATININE 0.61 0.64 0.66 0.79 0.74  CALCIUM 8.1*  7.9* 7.9* 8.3* 8.1*  MG 1.7 1.7 1.8  --  1.9   Liver Function Tests: Recent Labs  Lab 11/22/18 0255 11/23/18 0415 11/24/18 0248 11/25/18 0338 11/26/18 0409  AST 40 28 52* 30 51*  ALT 58* 47* 55* 44 49*  ALKPHOS 67 78 137* 122 102  BILITOT 1.8* 1.5* 1.0 1.3* 0.7  PROT  5.6* 5.2* 5.2* 5.8* 5.3*  ALBUMIN 2.9* 2.5* 2.4* 2.7* 2.3*   Recent Labs  Lab 11/20/18 0558 11/22/18 0255 11/23/18 0415 11/24/18 0248 11/25/18 0338  LIPASE 1,007* 40 20 170* 26   No results for input(s): AMMONIA in the last 168 hours. CBC: Recent Labs  Lab 11/22/18 0255 11/23/18 0415 11/24/18 0248 11/25/18 0338 11/26/18 0409  WBC 14.3* 12.2* 9.1 9.0 9.8  HGB 12.1 11.4* 11.2* 12.8 11.5*  HCT 36.9 34.1* 34.1* 37.8 34.9*  MCV 90.0 90.7 89.7 87.5 89.3  PLT 239 233 244 302 283   Cardiac Enzymes: No results for input(s): CKTOTAL, CKMB, CKMBINDEX, TROPONINI in the last 168 hours. BNP: BNP (last 3 results) Recent Labs    11/23/18 0415  BNP 331.4*    ProBNP (last 3 results) No results for input(s): PROBNP in the last 8760 hours.  CBG: No results for input(s): GLUCAP in the last 168 hours.     Signed:  Edsel PetrinMaryann Wanya Bangura  [facility] 11/26/2018, 12:38 PM

## 2018-11-26 NOTE — Progress Notes (Signed)
[facility]/Trauma Progress Note  1 Day Post-Op   Assessment/Plan  Gallstone pancreatitis - S/P lap chole with IOC, Dr. Sheliah Hatch, 02/04 - pt is ok for discharge from a surgical standpoint  FEN: reg diet VTE: SCD's, lovenox ID: none Foley: none Follow up:  CCS 2 weeks    LOS: 6 days    Subjective: CC: abdominal soreness  Pt is tolerating diet and having minimal pain. We discussed discharge instructions, importance of ambulation, and follow up.   Objective: Vital signs in last 24 hours: Temp:  [97.7 F (36.5 C)-98.7 F (37.1 C)] 98.4 F (36.9 C) (02/05 6754) Pulse Rate:  [68-85] 80 (02/05 0633) Resp:  [12-22] 19 (02/05 0633) BP: (97-134)/(55-81) 117/81 (02/05 0633) SpO2:  [93 %-96 %] 95 % (02/05 4920) Weight:  [81.9 kg] 81.9 kg (02/05 0437) Last BM Date: 11/25/18  Intake/Output from previous day: 02/04 0701 - 02/05 0700 In: 1270 [P.O.:120; I.V.:1100] Out: 15 [Blood:15] Intake/Output this shift: No intake/output data recorded.  PE: Gen:  Alert, NAD, pleasant, cooperative Pulm:  Rate and effort normal Abd: Soft, ND, +BS, incisions with glue intact appear well without signs of infection. Mild TTP of RUQ without guarding. No peritonitis  Skin: no rashes noted, warm and dry   Anti-infectives: Anti-infectives (From admission, onward)   None      Lab Results:  Recent Labs    11/25/18 0338 11/26/18 0409  WBC 9.0 9.8  HGB 12.8 11.5*  HCT 37.8 34.9*  PLT 302 283   BMET Recent Labs    11/25/18 0338 11/26/18 0409  NA 139 140  K 3.3* 4.2  CL 102 104  CO2 30 25  GLUCOSE 104* 108*  BUN 9 11  CREATININE 0.79 0.74  CALCIUM 8.3* 8.1*   PT/INR No results for input(s): LABPROT, INR in the last 72 hours. CMP     Component Value Date/Time   NA 140 11/26/2018 0409   K 4.2 11/26/2018 0409   CL 104 11/26/2018 0409   CO2 25 11/26/2018 0409   GLUCOSE 108 (H) 11/26/2018 0409   BUN 11 11/26/2018 0409   CREATININE 0.74 11/26/2018 0409   CALCIUM 8.1 (L) 11/26/2018 0409   PROT 5.3 (L) 11/26/2018 0409   ALBUMIN 2.3 (L) 11/26/2018 0409   AST 51 (H) 11/26/2018 0409   ALT 49 (H) 11/26/2018 0409   ALKPHOS 102 11/26/2018 0409   BILITOT 0.7 11/26/2018 0409   GFRNONAA >60 11/26/2018 0409   GFRAA >60 11/26/2018 0409   Lipase     Component Value Date/Time   LIPASE 26 11/25/2018 0338    Studies/Results: Dg Cholangiogram Operative  Result Date: 11/25/2018 CLINICAL DATA:  68 year old female undergoing elective laparoscopic cholecystectomy EXAM: INTRAOPERATIVE CHOLANGIOGRAM TECHNIQUE: Cholangiographic images from the C-arm fluoroscopic device were submitted for interpretation post-operatively. Please see the procedural report for the amount of contrast and the fluoroscopy time utilized. COMPARISON:  MRCP 11/20/2018 FINDINGS: A single cine clip is submitted for review. The images demonstrate cannulation of the cystic duct remanent and opacification of the biliary tree. No evidence of biliary ductal dilatation, stenosis, stricture or choledocholithiasis. Contrast passes freely through the ampulla and into the duodenum. IMPRESSION: Negative intraoperative cholangiogram. Electronically Signed   By: Malachy Moan M.D.   On: 11/25/2018 14:30      Jerre Simon , [facility] 11/26/2018, 10:14 AM  Pager: [phone] Mon-Wed, Friday 7:00am-4:30pm Thurs 7am-11:30am  Consults: [phone]

## 2018-11-26 NOTE — Discharge Instructions (Signed)

## 2019-06-10 ENCOUNTER — Encounter: Payer: Medicare Other | Admitting: Family Medicine

## 2019-07-06 ENCOUNTER — Ambulatory Visit (INDEPENDENT_AMBULATORY_CARE_PROVIDER_SITE_OTHER): Payer: Medicare Other | Admitting: Family Medicine

## 2019-07-06 ENCOUNTER — Encounter: Payer: Self-pay | Admitting: Family Medicine

## 2019-07-06 ENCOUNTER — Other Ambulatory Visit: Payer: Self-pay

## 2019-07-06 ENCOUNTER — Encounter: Payer: Self-pay | Admitting: General Practice

## 2019-07-06 VITALS — BP 126/86 | HR 76 | Temp 97.9°F | Resp 16 | Ht 65.0 in | Wt 152.4 lb

## 2019-07-06 DIAGNOSIS — Z23 Encounter for immunization: Secondary | ICD-10-CM

## 2019-07-06 DIAGNOSIS — Z1211 Encounter for screening for malignant neoplasm of colon: Secondary | ICD-10-CM

## 2019-07-06 DIAGNOSIS — E041 Nontoxic single thyroid nodule: Secondary | ICD-10-CM

## 2019-07-06 DIAGNOSIS — Z0000 Encounter for general adult medical examination without abnormal findings: Secondary | ICD-10-CM | POA: Diagnosis not present

## 2019-07-06 DIAGNOSIS — Z8719 Personal history of other diseases of the digestive system: Secondary | ICD-10-CM

## 2019-07-06 DIAGNOSIS — Z780 Asymptomatic menopausal state: Secondary | ICD-10-CM

## 2019-07-06 DIAGNOSIS — E663 Overweight: Secondary | ICD-10-CM | POA: Diagnosis not present

## 2019-07-06 LAB — CBC WITH DIFFERENTIAL/PLATELET
Basophils Absolute: 0.1 10*3/uL (ref 0.0–0.1)
Basophils Relative: 1.3 % (ref 0.0–3.0)
Eosinophils Absolute: 0.1 10*3/uL (ref 0.0–0.7)
Eosinophils Relative: 1.5 % (ref 0.0–5.0)
HCT: 40.6 % (ref 36.0–46.0)
Hemoglobin: 13.5 g/dL (ref 12.0–15.0)
Lymphocytes Relative: 35.6 % (ref 12.0–46.0)
Lymphs Abs: 1.8 10*3/uL (ref 0.7–4.0)
MCHC: 33.1 g/dL (ref 30.0–36.0)
MCV: 92.6 fl (ref 78.0–100.0)
Monocytes Absolute: 0.4 10*3/uL (ref 0.1–1.0)
Monocytes Relative: 8.3 % (ref 3.0–12.0)
Neutro Abs: 2.7 10*3/uL (ref 1.4–7.7)
Neutrophils Relative %: 53.3 % (ref 43.0–77.0)
Platelets: 299 10*3/uL (ref 150.0–400.0)
RBC: 4.39 Mil/uL (ref 3.87–5.11)
RDW: 13.2 % (ref 11.5–15.5)
WBC: 5.1 10*3/uL (ref 4.0–10.5)

## 2019-07-06 LAB — HEPATIC FUNCTION PANEL
ALT: 14 U/L (ref 0–35)
AST: 17 U/L (ref 0–37)
Albumin: 4 g/dL (ref 3.5–5.2)
Alkaline Phosphatase: 54 U/L (ref 39–117)
Bilirubin, Direct: 0.1 mg/dL (ref 0.0–0.3)
Total Bilirubin: 0.7 mg/dL (ref 0.2–1.2)
Total Protein: 6.5 g/dL (ref 6.0–8.3)

## 2019-07-06 LAB — BASIC METABOLIC PANEL
BUN: 16 mg/dL (ref 6–23)
CO2: 27 mEq/L (ref 19–32)
Calcium: 9.2 mg/dL (ref 8.4–10.5)
Chloride: 105 mEq/L (ref 96–112)
Creatinine, Ser: 0.68 mg/dL (ref 0.40–1.20)
GFR: 85.58 mL/min (ref >60.00)
Glucose, Bld: 83 mg/dL (ref 70–99)
Potassium: 4.3 mEq/L (ref 3.5–5.1)
Sodium: 139 mEq/L (ref 135–145)

## 2019-07-06 LAB — TSH: TSH: 1.23 u[IU]/mL (ref 0.35–4.50)

## 2019-07-06 LAB — LIPID PANEL
Cholesterol: 173 mg/dL (ref 0–200)
HDL: 45.4 mg/dL (ref >39.00)
LDL Cholesterol: 108 mg/dL — ABNORMAL HIGH (ref 0–99)
NonHDL: 127.2
Total CHOL/HDL Ratio: 4
Triglycerides: 96 mg/dL (ref 0.0–149.0)
VLDL: 19.2 mg/dL (ref 0.0–40.0)

## 2019-07-06 NOTE — Patient Instructions (Signed)
Follow up in 1 year or as needed We'll notify you of your lab results and make any changes if needed We'll call you with your GI appt for the colonoscopy We'll call you with your bone density appt and thyroid ultrasound Keep up the good work on healthy diet and regular exercise- you look great! Call with any questions or concerns Stay Safe!!!

## 2019-07-06 NOTE — Progress Notes (Signed)
   Subjective:    Patient ID: Joy Mosley, female    DOB: 1951-04-25, 68 y.o.   MRN: [id number]  HPI Here today for MWV.  Risk Factors: Overweight- pt has lost 25 lbs since last visit in 2016.  Lost quite a bit of weight during January/Feb hospitalization.  Now eating chicken and fish but no beef. Hx of pancreatitis- pt was dx'd w/ gallstone pancreatitis January 2020.  Had cholecystectomy 11/25/18 for common bile duct stone.  Has not had issues since her hospitalization. Neck tenderness- pt reports TTP and 'something there' over L side of thyroid.  Denies changes to skin/hair/nails, palpitations. Physical Activity: walking regularly Fall Risk: low Depression: denies Hearing: normal to conversational tones, mildly decreased to whispered voice ADL's: independent Cognitive: normal linear thought process, memory and attention intact Home Safety: safe at home Height, Weight, BMI, Visual Acuity: see vitals, vision corrected to 20/20 w/ glasses Counseling: due for colonoscopy, DEXA.  Declines flu.  Due for Prevnar Labs Ordered: See A&P Care Plan: See A&P   Patient Care Team    Relationship Specialty Notifications Start End  Midge Minium, MD PCP - General   10/04/10      Review of Systems  Patient reports no vision/ hearing changes, adenopathy,fever, persistant/recurrent hoarseness , swallowing issues, chest pain, palpitations, edema, persistant/recurrent cough, hemoptysis, dyspnea (rest/exertional/paroxysmal nocturnal), gastrointestinal bleeding (melena, rectal bleeding), abdominal pain, significant heartburn, bowel changes, GU symptoms (dysuria, hematuria, incontinence), Gyn symptoms (abnormal  bleeding, pain),  syncope, focal weakness, memory loss, numbness & tingling, skin/hair/nail changes, abnormal bruising or bleeding, anxiety, or depression.     Objective:   Physical Exam General Appearance:    Alert, cooperative, no distress, appears stated age  Head:    Normocephalic,  without obvious abnormality, atraumatic  Eyes:    PERRL, conjunctiva/corneas clear, EOM's intact, fundi    benign, both eyes  Ears:    Normal TM's and external ear canals, both ears  Nose:   Deferred due to COVID  Throat:   Neck:   Supple, symmetrical, trachea midline, no adenopathy;    Thyroid: questionable L sided nodule, TTP  Back:     Symmetric, no curvature, ROM normal, no CVA tenderness  Lungs:     Clear to auscultation bilaterally, respirations unlabored  Chest Wall:    No tenderness or deformity   Heart:    Regular rate and rhythm, S1 and S2 normal, no murmur, rub   or gallop  Breast Exam:    Deferred to mammo  Abdomen:     Soft, non-tender, bowel sounds active all four quadrants,    no masses, no organomegaly  Genitalia:    Deferred  Rectal:    Extremities:   Extremities normal, atraumatic, no cyanosis or edema  Pulses:   2+ and symmetric all extremities  Skin:   Skin color, texture, turgor normal, no rashes or lesions  Lymph nodes:   Cervical, supraclavicular, and axillary nodes normal  Neurologic:   CNII-XII intact, normal strength, sensation and reflexes    throughout          Assessment & Plan:  L thyroid nodule- new.  Check labs and get [place] to assess.  Pt expressed understanding and is in agreement w/ plan.

## 2019-07-06 NOTE — Assessment & Plan Note (Signed)
Pt has lost 25 lbs since last visit 4 yrs ago.  She reports this occurred during her hospitalization for acute pancreatitis.  Check labs to risk stratify.  Will follow.

## 2019-07-06 NOTE — Assessment & Plan Note (Signed)
Pt's PE WNL w/ exception of L thyroid.  [place] ordered.  Referral to GI for repeat colonoscopy.  DEXA ordered.  UTD on mammo.  Prevnar given.  Declined flu shot.  Anticipatory guidance provided.

## 2019-07-06 NOTE — Assessment & Plan Note (Signed)
Due to gallstones.  Currently asymptomatic.

## 2019-07-09 ENCOUNTER — Other Ambulatory Visit: Payer: Self-pay

## 2019-07-09 ENCOUNTER — Ambulatory Visit (INDEPENDENT_AMBULATORY_CARE_PROVIDER_SITE_OTHER)
Admission: RE | Admit: 2019-07-09 | Discharge: 2019-07-09 | Disposition: A | Discharge status: Home / Self Care | Payer: Medicare Other | Source: Ambulatory Visit | Attending: Family Medicine | Admitting: Family Medicine

## 2019-07-09 DIAGNOSIS — Z780 Asymptomatic menopausal state: Secondary | ICD-10-CM | POA: Diagnosis not present

## 2019-07-15 ENCOUNTER — Other Ambulatory Visit: Payer: Self-pay | Admitting: Family Medicine

## 2019-07-15 ENCOUNTER — Ambulatory Visit (HOSPITAL_BASED_OUTPATIENT_CLINIC_OR_DEPARTMENT_OTHER)
Admission: RE | Admit: 2019-07-15 | Discharge: 2019-07-15 | Disposition: A | Discharge status: Home / Self Care | Payer: Medicare Other | Source: Ambulatory Visit | Attending: Family Medicine | Admitting: Family Medicine

## 2019-07-15 ENCOUNTER — Other Ambulatory Visit: Payer: Self-pay

## 2019-07-15 DIAGNOSIS — E042 Nontoxic multinodular goiter: Secondary | ICD-10-CM

## 2019-07-15 DIAGNOSIS — E041 Nontoxic single thyroid nodule: Secondary | ICD-10-CM | POA: Diagnosis not present

## 2019-07-22 ENCOUNTER — Other Ambulatory Visit: Payer: Self-pay | Admitting: Otolaryngology

## 2019-07-22 DIAGNOSIS — E040 Nontoxic diffuse goiter: Secondary | ICD-10-CM

## 2019-07-22 DIAGNOSIS — E049 Nontoxic goiter, unspecified: Secondary | ICD-10-CM

## 2019-07-23 ENCOUNTER — Ambulatory Visit
Admission: RE | Admit: 2019-07-23 | Discharge: 2019-07-23 | Disposition: A | Discharge status: Home / Self Care | Payer: Medicare Other | Source: Ambulatory Visit | Attending: Otolaryngology | Admitting: Otolaryngology

## 2019-07-23 ENCOUNTER — Other Ambulatory Visit (HOSPITAL_COMMUNITY)
Admission: RE | Admit: 2019-07-23 | Discharge: 2019-07-23 | Disposition: A | Discharge status: Home / Self Care | Payer: Medicare Other | Source: Ambulatory Visit | Attending: Radiology | Admitting: Radiology

## 2019-07-23 DIAGNOSIS — E040 Nontoxic diffuse goiter: Secondary | ICD-10-CM

## 2019-07-23 DIAGNOSIS — E049 Nontoxic goiter, unspecified: Secondary | ICD-10-CM

## 2019-07-23 DIAGNOSIS — E042 Nontoxic multinodular goiter: Secondary | ICD-10-CM | POA: Insufficient documentation

## 2019-07-23 DIAGNOSIS — E041 Nontoxic single thyroid nodule: Secondary | ICD-10-CM | POA: Diagnosis present

## 2019-07-27 LAB — CYTOLOGY - NON PAP

## 2019-07-29 ENCOUNTER — Encounter: Payer: Self-pay | Admitting: Family Medicine

## 2019-08-03 LAB — HM COLONOSCOPY

## 2019-08-27 ENCOUNTER — Other Ambulatory Visit: Payer: Self-pay | Admitting: Family Medicine

## 2019-08-27 DIAGNOSIS — Z1231 Encounter for screening mammogram for malignant neoplasm of breast: Secondary | ICD-10-CM

## 2019-09-01 ENCOUNTER — Encounter: Payer: Self-pay | Admitting: General Practice

## 2019-10-21 ENCOUNTER — Ambulatory Visit
Admission: RE | Admit: 2019-10-21 | Discharge: 2019-10-21 | Disposition: A | Discharge status: Home / Self Care | Payer: Medicare Other | Source: Ambulatory Visit | Attending: Family Medicine | Admitting: Family Medicine

## 2019-10-21 ENCOUNTER — Other Ambulatory Visit: Payer: Self-pay

## 2019-10-21 DIAGNOSIS — Z1231 Encounter for screening mammogram for malignant neoplasm of breast: Secondary | ICD-10-CM

## 2019-11-11 ENCOUNTER — Ambulatory Visit: Payer: Medicare Other | Attending: Internal Medicine

## 2019-11-11 DIAGNOSIS — Z23 Encounter for immunization: Secondary | ICD-10-CM | POA: Insufficient documentation

## 2019-12-02 ENCOUNTER — Ambulatory Visit: Payer: Medicare Other | Attending: Internal Medicine

## 2019-12-02 DIAGNOSIS — Z23 Encounter for immunization: Secondary | ICD-10-CM | POA: Insufficient documentation

## 2019-12-02 NOTE — Progress Notes (Signed)
   Covid-19 Vaccination Clinic  Name:  Joy Mosley    MRN: [id number] DOB: Dec 06, 1950  12/02/2019  [name] was observed post Covid-19 immunization for 15 minutes without incidence. She was provided with Vaccine Information Sheet and instruction to access the V-Safe system.   [name] was instructed to call 911 with any severe reactions post vaccine: Marland Kitchen Difficulty breathing  . Swelling of your face and throat  . A fast heartbeat  . A bad rash all over your body  . Dizziness and weakness    Immunizations Administered    Name Date Dose VIS Date Route   Pfizer COVID-19 Vaccine 12/02/2019  9:33 AM 0.3 mL 10/02/2019 Intramuscular   Manufacturer: [employer], [employer]   Lot: [id number]   NDC: [address]-9

## 2020-05-26 ENCOUNTER — Encounter: Payer: Self-pay | Admitting: Family Medicine

## 2020-05-26 ENCOUNTER — Ambulatory Visit (INDEPENDENT_AMBULATORY_CARE_PROVIDER_SITE_OTHER): Payer: Medicare Other | Admitting: Family Medicine

## 2020-05-26 ENCOUNTER — Other Ambulatory Visit: Payer: Self-pay

## 2020-05-26 ENCOUNTER — Other Ambulatory Visit: Payer: Self-pay | Admitting: Family Medicine

## 2020-05-26 VITALS — BP 121/81 | HR 84 | Temp 98.1°F | Resp 16 | Ht 64.0 in | Wt 153.1 lb

## 2020-05-26 DIAGNOSIS — N6459 Other signs and symptoms in breast: Secondary | ICD-10-CM

## 2020-05-26 DIAGNOSIS — Z803 Family history of malignant neoplasm of breast: Secondary | ICD-10-CM | POA: Diagnosis not present

## 2020-05-26 DIAGNOSIS — R490 Dysphonia: Secondary | ICD-10-CM

## 2020-05-26 MED ORDER — CETIRIZINE HCL 10 MG PO TABS: 10.0000 mg | ORAL_TABLET | Freq: Every day | ORAL | 11 refills | Status: DC

## 2020-05-26 NOTE — Patient Instructions (Addendum)
Schedule your routine follow up after 9/14 We'll call you with your diagnostic mammo to assess Wear a good, supportive bra Start the once daily Cetirizine (Zyrtec) to decrease post nasal drip and hopefully improve hoarseness Call with any questions or concerns Stay Safe!  Stay Healthy!

## 2020-05-26 NOTE — Assessment & Plan Note (Signed)
Pt has 2 sisters w/ breast cancer so any breast complaint warrants a thorough evaluation.  Order for diagnostic mammo placed.

## 2020-05-26 NOTE — Assessment & Plan Note (Signed)
New.  Pt reports this has been going on 'awhile'.  Feels it may be allergy related as daughter has both a dog and cat in the house.  She reports some nasal congestion and post-nasal drip.  Will start daily antihistamine and monitor for improvement.  If hoarseness doesn't improve, will need to do additional workup as pt has known multinodular goiter.

## 2020-05-26 NOTE — Progress Notes (Signed)
   Subjective:    Patient ID: Joy Mosley, female    DOB: [birth year], 69 y.o.   MRN: [id number]  HPI Breast sensitivity- R breast, no TTP, no palpable lumps.  'it's like inside there is a heaviness.  Like a tingling.  It just feels different.'.  UTD on routine mammo (09/2019).  2 sisters w/ breast cancer.  sxs started ~1 week ago.  Hoarseness- pt reports this is an ongoing thing.  Reports + nasal congestion, PND.  Daughter has both a dog and a cat.  Not currently using nasal steroid or allergy medication.   Review of Systems For ROS see HPI   This visit occurred during the SARS-CoV-2 public health emergency.  Safety protocols were in place, including screening questions prior to the visit, additional usage of staff PPE, and extensive cleaning of exam room while observing appropriate contact time as indicated for disinfecting solutions.       Objective:   Physical Exam Vitals reviewed.  Constitutional:      General: She is not in acute distress.    Appearance: Normal appearance. She is not ill-appearing.  HENT:     Head: Normocephalic and atraumatic.  Chest:     Breasts:        Right: Normal. No inverted nipple, mass, nipple discharge, skin change or tenderness.        Left: Normal. No inverted nipple, mass, nipple discharge, skin change or tenderness.  Neurological:     General: No focal deficit present.     Mental Status: She is alert and oriented to person, place, and time.  Psychiatric:        Mood and Affect: Mood normal.        Behavior: Behavior normal.        Thought Content: Thought content normal.           Assessment & Plan:  Breast complaint- pt reports R breast has felt 'different' for the past week.  'heavy, tingling'.  Given strong family hx of breast cancer will order diagnostic mammo.  Thankfully no palpable abnormality in office today.  Will follow closely.

## 2020-06-09 ENCOUNTER — Other Ambulatory Visit: Payer: Medicare Other

## 2020-06-09 ENCOUNTER — Ambulatory Visit
Admission: RE | Admit: 2020-06-09 | Discharge: 2020-06-09 | Disposition: A | Discharge status: Home / Self Care | Payer: Medicare Other | Source: Ambulatory Visit | Attending: Family Medicine | Admitting: Family Medicine

## 2020-06-09 DIAGNOSIS — N6459 Other signs and symptoms in breast: Secondary | ICD-10-CM

## 2020-06-09 DIAGNOSIS — Z803 Family history of malignant neoplasm of breast: Secondary | ICD-10-CM

## 2020-07-13 ENCOUNTER — Ambulatory Visit (INDEPENDENT_AMBULATORY_CARE_PROVIDER_SITE_OTHER): Payer: Medicare Other | Admitting: Family Medicine

## 2020-07-13 ENCOUNTER — Encounter: Payer: Self-pay | Admitting: Family Medicine

## 2020-07-13 ENCOUNTER — Other Ambulatory Visit: Payer: Self-pay

## 2020-07-13 VITALS — BP 120/78 | HR 73 | Temp 98.0°F | Resp 16 | Ht 64.0 in | Wt 150.2 lb

## 2020-07-13 DIAGNOSIS — Z23 Encounter for immunization: Secondary | ICD-10-CM

## 2020-07-13 DIAGNOSIS — E663 Overweight: Secondary | ICD-10-CM

## 2020-07-13 DIAGNOSIS — E041 Nontoxic single thyroid nodule: Secondary | ICD-10-CM | POA: Insufficient documentation

## 2020-07-13 LAB — CBC WITH DIFFERENTIAL/PLATELET
Basophils Absolute: 0 10*3/uL (ref 0.0–0.1)
Basophils Relative: 0.8 % (ref 0.0–3.0)
Eosinophils Absolute: 0.1 10*3/uL (ref 0.0–0.7)
Eosinophils Relative: 1.4 % (ref 0.0–5.0)
HCT: 39.2 % (ref 36.0–46.0)
Hemoglobin: 13.1 g/dL (ref 12.0–15.0)
Lymphocytes Relative: 32.3 % (ref 12.0–46.0)
Lymphs Abs: 1.9 10*3/uL (ref 0.7–4.0)
MCHC: 33.4 g/dL (ref 30.0–36.0)
MCV: 91.9 fl (ref 78.0–100.0)
Monocytes Absolute: 0.6 10*3/uL (ref 0.1–1.0)
Monocytes Relative: 9.6 % (ref 3.0–12.0)
Neutro Abs: 3.2 10*3/uL (ref 1.4–7.7)
Neutrophils Relative %: 55.9 % (ref 43.0–77.0)
Platelets: 288 10*3/uL (ref 150.0–400.0)
RBC: 4.26 Mil/uL (ref 3.87–5.11)
RDW: 13.3 % (ref 11.5–15.5)
WBC: 5.8 10*3/uL (ref 4.0–10.5)

## 2020-07-13 LAB — BASIC METABOLIC PANEL
BUN: 19 mg/dL (ref 6–23)
CO2: 29 mEq/L (ref 19–32)
Calcium: 8.8 mg/dL (ref 8.4–10.5)
Chloride: 105 mEq/L (ref 96–112)
Creatinine, Ser: 0.71 mg/dL (ref 0.40–1.20)
GFR: 81.18 mL/min (ref >60.00)
Glucose, Bld: 62 mg/dL — ABNORMAL LOW (ref 70–99)
Potassium: 4 mEq/L (ref 3.5–5.1)
Sodium: 140 mEq/L (ref 135–145)

## 2020-07-13 LAB — LIPID PANEL
Cholesterol: 158 mg/dL (ref 0–200)
HDL: 45.5 mg/dL (ref >39.00)
LDL Cholesterol: 101 mg/dL — ABNORMAL HIGH (ref 0–99)
NonHDL: 112.41
Total CHOL/HDL Ratio: 3
Triglycerides: 59 mg/dL (ref 0.0–149.0)
VLDL: 11.8 mg/dL (ref 0.0–40.0)

## 2020-07-13 LAB — TSH: TSH: 0.97 u[IU]/mL (ref 0.35–4.50)

## 2020-07-13 LAB — HEPATIC FUNCTION PANEL
ALT: 13 U/L (ref 0–35)
AST: 17 U/L (ref 0–37)
Albumin: 4.1 g/dL (ref 3.5–5.2)
Alkaline Phosphatase: 51 U/L (ref 39–117)
Bilirubin, Direct: 0.1 mg/dL (ref 0.0–0.3)
Total Bilirubin: 0.8 mg/dL (ref 0.2–1.2)
Total Protein: 6.4 g/dL (ref 6.0–8.3)

## 2020-07-13 NOTE — Assessment & Plan Note (Signed)
Pt is down 3 lbs since last visit.  Has been exercising at home b/c the [employer] is closed.  Has eliminated beef and pork from diet.  Applauded her efforts.  Check labs to risk stratify.

## 2020-07-13 NOTE — Addendum Note (Signed)
Addended by: Lana Fish on: 07/13/2020 10:19 AM   Modules accepted: Orders

## 2020-07-13 NOTE — Assessment & Plan Note (Signed)
Ongoing issue for pt.  Had bx of both nodules last year and both were benign.  There was no recommended f/u so we will plan on [place] q2 yrs to follow for growth or change.

## 2020-07-13 NOTE — Patient Instructions (Signed)
Follow up in 1 year or as needed We'll notify you of your lab results and make any changes if needed We'll plan on a thyroid ultrasound every 2 yrs to assess (unless there are any changes) Keep up the good work on healthy diet and regular exercise- you're doing great! You do qualify for a COVID booster and this can be done at your convenience at the pharmacy Call with any questions or concerns Stay Safe!  Stay Healthy!

## 2020-07-13 NOTE — Progress Notes (Signed)
   Subjective:    Patient ID: Joy Mosley, female    DOB: [birth year], 69 y.o.   MRN: [id number]  HPI Overweight- pt is actually down 3 lbs since last visit.  BMI is 25.79.  Pt reports feeling good.  Has been doing exercises at home.  Tries to eat based on blood type- has eliminated beef and pork.  No CP, SOB, visual changes, abd pain, N/V.  No numbness/tingling of hands/feet.  Thyroid nodules- had biopsy done last year that were consistent w/ benign follicular nodule.  Pt does not notice a change/difference.  Will plan on [place] q2 yrs to assess.  Health Maintenance- UTD on mammo, colonoscopy, DEXA.  UTD on COVID, Prevnar.  Due for Pneumovax.  Declines flu.   Review of Systems For ROS see HPI   This visit occurred during the SARS-CoV-2 public health emergency.  Safety protocols were in place, including screening questions prior to the visit, additional usage of staff PPE, and extensive cleaning of exam room while observing appropriate contact time as indicated for disinfecting solutions.       Objective:   Physical Exam Vitals reviewed.  Constitutional:      General: She is not in acute distress.    Appearance: Normal appearance. She is well-developed.  HENT:     Head: Normocephalic and atraumatic.     Right Ear: Tympanic membrane normal.     Left Ear: Tympanic membrane normal.  Eyes:     Conjunctiva/sclera: Conjunctivae normal.     Pupils: Pupils are equal, round, and reactive to light.  Neck:     Thyroid: No thyromegaly.  Cardiovascular:     Rate and Rhythm: Normal rate and regular rhythm.     Heart sounds: Normal heart sounds. No murmur heard.   Pulmonary:     Effort: Pulmonary effort is normal. No respiratory distress.     Breath sounds: Normal breath sounds.  Abdominal:     General: There is no distension.     Palpations: Abdomen is soft.     Tenderness: There is no abdominal tenderness.  Musculoskeletal:     Cervical back: Normal range of motion and neck supple.      Right lower leg: No edema.     Left lower leg: No edema.  Lymphadenopathy:     Cervical: No cervical adenopathy.  Skin:    General: Skin is warm and dry.  Neurological:     Mental Status: She is alert and oriented to person, place, and time.  Psychiatric:        Behavior: Behavior normal.           Assessment & Plan:

## 2020-07-14 ENCOUNTER — Encounter: Payer: Self-pay | Admitting: General Practice

## 2020-09-21 ENCOUNTER — Other Ambulatory Visit: Payer: Self-pay | Admitting: Family Medicine

## 2020-09-21 DIAGNOSIS — Z1231 Encounter for screening mammogram for malignant neoplasm of breast: Secondary | ICD-10-CM

## 2020-09-27 ENCOUNTER — Telehealth: Payer: Self-pay | Admitting: Family Medicine

## 2020-09-27 NOTE — Telephone Encounter (Signed)
Left message for patient to schedule Annual Wellness Visit.  Please schedule with Nurse Health Advisor Martha Stanley, RN at [facility]  

## 2020-09-30 NOTE — Progress Notes (Signed)
Subjective:   Joy Mosley is a 69 y.o. female who presents for Medicare Annual (Subsequent) preventive examination.  I connected with Joy Mosley today by telephone and verified that I am speaking with the correct person using two identifiers. Location patient: home Location provider: work Persons participating in the virtual visit: patient, Engineer, civil (consulting).    I discussed the limitations, risks, security and privacy concerns of performing an evaluation and management service by telephone and the availability of in person appointments. I also discussed with the patient that there may be a patient responsible charge related to this service. The patient expressed understanding and verbally consented to this telephonic visit.    Interactive audio and video telecommunications were attempted between this provider and patient, however failed, due to patient having technical difficulties OR patient did not have access to video capability.  We continued and completed visit with audio only.  Some vital signs may be absent or patient reported.   Time Spent with patient on telephone encounter: 20 minutes   Review of Systems     Cardiac Risk Factors include: advanced age (>65men, >24 women)     Objective:    Today's Vitals   10/03/20 1545  Weight: 150 lb (68 kg)  Height: 5\' 4"  (1.626 m)   Body mass index is 25.75 kg/m.  Advanced Directives 10/03/2020 07/06/2019 11/19/2018 04/06/2016 09/07/2014 08/08/2014  Does Patient Have a Medical Advance Directive? Yes Yes Yes Yes Yes No  Type of 08/10/2014 of [place];Living will Healthcare Power of [place];Living will Healthcare Power of [place];Living will Healthcare Power of [place];Living will Healthcare Power of [place];Living will -  Does patient want to make changes to medical advance directive? - Yes (MAU/Ambulatory/Procedural Areas - Information given) No - Patient declined No - Patient declined - -  Copy of Healthcare Power of  Attorney in Chart? No - copy requested No - copy requested No - copy requested No - copy requested - -    Current Medications (verified) Outpatient Encounter Medications as of 10/03/2020  Medication Sig  . cetirizine (ZYRTEC) 10 MG tablet Take 1 tablet (10 mg total) by mouth daily.  . feeding supplement, GLUCERNA SHAKE, (GLUCERNA SHAKE) LIQD Take 237 mLs by mouth 3 (three) times daily between meals.   No facility-administered encounter medications on file as of 10/03/2020.    Allergies (verified) Patient has no known allergies.   History: Past Medical History:  Diagnosis Date  . Diverticulitis    Past Surgical History:  Procedure Laterality Date  . BREAST CYST ASPIRATION    . CHOLECYSTECTOMY N/A 11/25/2018   Procedure: LAPAROSCOPIC CHOLECYSTECTOMY WITH  INTRAOPERATIVE CHOLANGIOGRAM;  Surgeon: Kinsinger, 01/24/2019, MD;  Location: [facility] OR;  Service: General;  Laterality: N/A;  . OVARIAN CYST REMOVAL  1980  . TONSILLECTOMY AND ADENOIDECTOMY     Family History  Problem Relation Age of Onset  . Leukemia Mother   . Cancer Father        prostate  . Cancer Maternal Grandfather   . Breast cancer Sister    Social History   Socioeconomic History  . Marital status: Widowed    Spouse name: Not on file  . Number of children: Not on file  . Years of education: Not on file  . Highest education level: Not on file  Occupational History  . Not on file  Tobacco Use  . Smoking status: Never Smoker  . Smokeless tobacco: Never Used  Substance and Sexual Activity  . Alcohol use: Yes  Comment: rarely  . Drug use: No  . Sexual activity: Not on file  Other Topics Concern  . Not on file  Social History Narrative  . Not on file   Social Determinants of Health   Financial Resource Strain: Low Risk   . Difficulty of Paying Living Expenses: Not hard at all  Food Insecurity: No Food Insecurity  . Worried About Programme researcher, broadcasting/film/videounning Out of Food in the Last Year: Never true  . Ran Out of Food in the  Last Year: Never true  Transportation Needs: No Transportation Needs  . Lack of Transportation (Medical): No  . Lack of Transportation (Non-Medical): No  Physical Activity: Insufficiently Active  . Days of Exercise per Week: 4 days  . Minutes of Exercise per Session: 20 min  Stress: No Stress Concern Present  . Feeling of Stress : Not at all  Social Connections: Moderately Isolated  . Frequency of Communication with Friends and Family: More than three times a week  . Frequency of Social Gatherings with Friends and Family: More than three times a week  . Attends Religious Services: Never  . Active Member of Clubs or Organizations: Yes  . Attends BankerClub or Organization Meetings: More than 4 times per year  . Marital Status: Widowed    Tobacco Counseling Counseling given: Not Answered   Clinical Intake:  Pre-visit preparation completed: Yes  Pain : No/denies pain     Nutritional Status: BMI 25 -29 Overweight Nutritional Risks: None Diabetes: No  How often do you need to have someone help you when you read instructions, pamphlets, or other written materials from your doctor or pharmacy?: 1 - Never What is the last grade level you completed in school?: College  Diabetic?No  Interpreter Needed?: No  Information entered by :: Thomasenia SalesMartha Vi Biddinger LPN   Activities of Daily Living In your present state of health, do you have any difficulty performing the following activities: 10/03/2020 07/13/2020  Hearing? N N  Vision? N N  Difficulty concentrating or making decisions? N N  Walking or climbing stairs? N N  Dressing or bathing? N N  Doing errands, shopping? N N  Preparing Food and eating ? N -  Using the Toilet? N -  In the past six months, have you accidently leaked urine? N -  Do you have problems with loss of bowel control? N -  Managing your Medications? N -  Managing your Finances? N -  Housekeeping or managing your Housekeeping? N -  Some recent data might be hidden     Patient Care Team: Sheliah Hatchabori, Katherine E, MD as PCP - General  Indicate any recent Medical Services you may have received from other than [facility] providers in the past year (date may be approximate).     Assessment:   This is a routine wellness examination for EuniceBonnie.  Hearing/Vision screen  Hearing Screening   125Hz  250Hz  500Hz  1000Hz  2000Hz  3000Hz  4000Hz  6000Hz  8000Hz   Right ear:           Left ear:           Comments: Mild hearing loss  Vision Screening Comments: Wears glasses Last eye exam-07/2019 Dr.Bowman  Dietary issues and exercise activities discussed: Current Exercise Habits: Home exercise routine, Type of exercise: strength training/weights;Other - see comments, Time (Minutes): 20, Frequency (Times/Week): 3, Weekly Exercise (Minutes/Week): 60, Intensity: Mild, Exercise limited by: None identified  Goals    . Patient Stated     Increase activity, drink more water & lose weight  Depression Screen PHQ 2/9 Scores 10/03/2020 07/13/2020 05/26/2020 07/06/2019 02/04/2015 08/05/2014  PHQ - 2 Score 0 0 0 0 0 0  PHQ- 9 Score - 0 - 0 - -    Fall Risk Fall Risk  10/03/2020 07/13/2020 05/26/2020 07/06/2019 02/04/2015  Falls in the past year? 0 0 0 0 Yes  Number falls in past yr: 0 0 0 0 1  Injury with Fall? 0 0 0 0 Yes  Comment - - - - concussion  Risk for fall due to : - - - - Other (Comment)  Risk for fall due to: Comment - - - - Pt tripped over a step  Follow up Falls prevention discussed Falls evaluation completed Falls evaluation completed - -    FALL RISK PREVENTION PERTAINING TO THE HOME:  Any stairs in or around the home? Yes  If so, are there any without handrails? No  Home free of loose throw rugs in walkways, pet beds, electrical cords, etc? Yes  Adequate lighting in your home to reduce risk of falls? Yes   ASSISTIVE DEVICES UTILIZED TO PREVENT FALLS:  Life alert? No  Use of a cane, walker or w/c? No  Grab bars in the bathroom? No  Shower chair or bench in  shower? Yes  Elevated toilet seat or a handicapped toilet? No   TIMED UP AND GO:  Was the test performed? No . Phone visit  Cognitive Function:Normal cognitive status assessed by this Nurse Health Advisor. No abnormalities found.          Immunizations Immunization History  Administered Date(s) Administered  . PFIZER SARS-COV-2 Vaccination 11/11/2019, 12/02/2019, 07/29/2020  . Pneumococcal Conjugate-13 07/06/2019  . Pneumococcal Polysaccharide-23 07/13/2020    TDAP status: Due, Education has been provided regarding the importance of this vaccine. Advised may receive this vaccine at local pharmacy or Health Dept. Aware to provide a copy of the vaccination record if obtained from local pharmacy or Health Dept. Verbalized acceptance and understanding.  Flu Vaccine status: Declined, Education has been provided regarding the importance of this vaccine but patient still declined. Advised may receive this vaccine at local pharmacy or Health Dept. Aware to provide a copy of the vaccination record if obtained from local pharmacy or Health Dept. Verbalized acceptance and understanding.  Pneumococcal vaccine status: Up to date  Covid-19 vaccine status: Completed vaccines  Qualifies for Shingles Vaccine? Yes   Zostavax completed No   Shingrix Completed?: No.    Education has been provided regarding the importance of this vaccine. Patient has been advised to call insurance company to determine out of pocket expense if they have not yet received this vaccine. Advised may also receive vaccine at local pharmacy or Health Dept. Verbalized acceptance and understanding.  Screening Tests Health Maintenance  Topic Date Due  . INFLUENZA VACCINE  01/19/2021 (Originally 05/22/2020)  . TETANUS/TDAP  07/13/2021 (Originally [birth year]/1968)  . MAMMOGRAM  06/09/2022  . COLONOSCOPY  08/02/2026  . DEXA SCAN  Completed  . COVID-19 Vaccine  Completed  . Hepatitis C Screening  Completed  . PNA vac Low Risk Adult   Completed    Health Maintenance  There are no preventive care reminders to display for this patient.  Colorectal cancer screening: Type of screening: Colonoscopy. Completed 08/03/2019. Repeat every 7 years  Mammogram status: Scheduled for 11/03/2020  Bone Density status: Completed 07/09/2019. Results reflect: Bone density results: OSTEOPENIA. Repeat every 2 years.  Lung Cancer Screening: (Low Dose CT Chest recommended if Age 79-80 years, 30 pack-year currently  smoking OR have quit w/in 15years.) does not qualify.     Additional Screening:  Hepatitis C Screening:  Completed 11/24/2018  Vision Screening: Recommended annual ophthalmology exams for early detection of glaucoma and other disorders of the eye. Is the patient up to date with their annual eye exam?  Yes  Who is the provider or what is the name of the office in which the patient attends annual eye exams? Dr. Orson Slick   Dental Screening: Recommended annual dental exams for proper oral hygiene  Community Resource Referral / Chronic Care Management: CRR required this visit?  No   CCM required this visit?  No      Plan:     I have personally reviewed and noted the following in the patient's chart:   . Medical and social history . Use of alcohol, tobacco or illicit drugs  . Current medications and supplements . Functional ability and status . Nutritional status . Physical activity . Advanced directives . List of other physicians . Hospitalizations, surgeries, and ER visits in previous 12 months . Vitals . Screenings to include cognitive, depression, and falls . Referrals and appointments  In addition, I have reviewed and discussed with patient certain preventive protocols, quality metrics, and best practice recommendations. A written personalized care plan for preventive services as well as general preventive health recommendations were provided to patient.  Due to this being a telephonic visit, the after visit  summary with patients personalized plan was offered to patient via mail or my-chart.  Patient would like to access on my-chart.     Roanna Raider, LPN   [id number]  Nurse Health Advisor  Nurse Notes: None

## 2020-10-03 ENCOUNTER — Ambulatory Visit (INDEPENDENT_AMBULATORY_CARE_PROVIDER_SITE_OTHER): Payer: Medicare Other

## 2020-10-03 VITALS — Ht 64.0 in | Wt 150.0 lb

## 2020-10-03 DIAGNOSIS — Z0000 Encounter for general adult medical examination without abnormal findings: Secondary | ICD-10-CM

## 2020-10-03 NOTE — Patient Instructions (Signed)
Joy Mosley , Thank you for taking time to complete your Medicare Wellness Visit. I appreciate your ongoing commitment to your health goals. Please review the following plan we discussed and let me know if I can assist you in the future.   Screening recommendations/referrals: Colonoscopy: Completed 08/03/2019-Not required after age 71. Mammogram: Scheduled for 11/03/2020 Bone Density: Completed 07/09/2019-Due-07/08/2021 Recommended yearly ophthalmology/optometry visit for glaucoma screening and checkup Recommended yearly dental visit for hygiene and checkup  Vaccinations: Influenza vaccine: Declined Pneumococcal vaccine: Completed vaccines Tdap vaccine: Discuss with pharmacy Shingles vaccine: Discuss with pharmacy  Covid-19:Completed vaccines  Advanced directives: Please bring a copy for your chart  Conditions/risks identified: See problem list  Next appointment: Follow up in one year for your annual wellness visit 10/09/2021 @ 10:30am   Preventive Care 71 Years and Older, Female Preventive care refers to lifestyle choices and visits with your health care provider that can promote health and wellness. What does preventive care include?  A yearly physical exam. This is also called an annual well check.  Dental exams once or twice a year.  Routine eye exams. Ask your health care provider how often you should have your eyes checked.  Personal lifestyle choices, including:  Daily care of your teeth and gums.  Regular physical activity.  Eating a healthy diet.  Avoiding tobacco and drug use.  Limiting alcohol use.  Practicing safe sex.  Taking low-dose aspirin every day.  Taking vitamin and mineral supplements as recommended by your health care provider. What happens during an annual well check? The services and screenings done by your health care provider during your annual well check will depend on your age, overall health, lifestyle risk factors, and family history of  disease. Counseling  Your health care provider may ask you questions about your:  Alcohol use.  Tobacco use.  Drug use.  Emotional well-being.  Home and relationship well-being.  Sexual activity.  Eating habits.  History of falls.  Memory and ability to understand (cognition).  Work and work Astronomer.  Reproductive health. Screening  You may have the following tests or measurements:  Height, weight, and BMI.  Blood pressure.  Lipid and cholesterol levels. These may be checked every 5 years, or more frequently if you are over 35 years old.  Skin check.  Lung cancer screening. You may have this screening every year starting at age 71 if you have a 30-pack-year history of smoking and currently smoke or have quit within the past 15 years.  Fecal occult blood test (FOBT) of the stool. You may have this test every year starting at age 71.  Flexible sigmoidoscopy or colonoscopy. You may have a sigmoidoscopy every 5 years or a colonoscopy every 10 years starting at age 71.  Hepatitis C blood test.  Hepatitis B blood test.  Sexually transmitted disease (STD) testing.  Diabetes screening. This is done by checking your blood sugar (glucose) after you have not eaten for a while (fasting). You may have this done every 1-3 years.  Bone density scan. This is done to screen for osteoporosis. You may have this done starting at age 107.  Mammogram. This may be done every 1-2 years. Talk to your health care provider about how often you should have regular mammograms. Talk with your health care provider about your test results, treatment options, and if necessary, the need for more tests. Vaccines  Your health care provider may recommend certain vaccines, such as:  Influenza vaccine. This is recommended every year.  Tetanus,  diphtheria, and acellular pertussis (Tdap, Td) vaccine. You may need a Td booster every 10 years.  Zoster vaccine. You may need this after age  71.  Pneumococcal 13-valent conjugate (PCV13) vaccine. One dose is recommended after age 73.  Pneumococcal polysaccharide (PPSV23) vaccine. One dose is recommended after age 10. Talk to your health care provider about which screenings and vaccines you need and how often you need them. This information is not intended to replace advice given to you by your health care provider. Make sure you discuss any questions you have with your health care provider. Document Released: 11/04/2015 Document Revised: 06/27/2016 Document Reviewed: 08/09/2015 Elsevier Interactive Patient Education  2017 [place] Prevention in the Home Falls can cause injuries. They can happen to people of all ages. There are many things you can do to make your home safe and to help prevent falls. What can I do on the outside of my home?  Regularly fix the edges of walkways and driveways and fix any cracks.  Remove anything that might make you trip as you walk through a door, such as a raised step or threshold.  Trim any bushes or trees on the path to your home.  Use bright outdoor lighting.  Clear any walking paths of anything that might make someone trip, such as rocks or tools.  Regularly check to see if handrails are loose or broken. Make sure that both sides of any steps have handrails.  Any raised decks and porches should have guardrails on the edges.  Have any leaves, snow, or ice cleared regularly.  Use sand or salt on walking paths during winter.  Clean up any spills in your garage right away. This includes oil or grease spills. What can I do in the bathroom?  Use night lights.  Install grab bars by the toilet and in the tub and shower. Do not use towel bars as grab bars.  Use non-skid mats or decals in the tub or shower.  If you need to sit down in the shower, use a plastic, non-slip stool.  Keep the floor dry. Clean up any water that spills on the floor as soon as it happens.  Remove  soap buildup in the tub or shower regularly.  Attach bath mats securely with double-sided non-slip rug tape.  Do not have throw rugs and other things on the floor that can make you trip. What can I do in the bedroom?  Use night lights.  Make sure that you have a light by your bed that is easy to reach.  Do not use any sheets or blankets that are too big for your bed. They should not hang down onto the floor.  Have a firm chair that has side arms. You can use this for support while you get dressed.  Do not have throw rugs and other things on the floor that can make you trip. What can I do in the kitchen?  Clean up any spills right away.  Avoid walking on wet floors.  Keep items that you use a lot in easy-to-reach places.  If you need to reach something above you, use a strong step stool that has a grab bar.  Keep electrical cords out of the way.  Do not use floor polish or wax that makes floors slippery. If you must use wax, use non-skid floor wax.  Do not have throw rugs and other things on the floor that can make you trip. What can I do with  my stairs?  Do not leave any items on the stairs.  Make sure that there are handrails on both sides of the stairs and use them. Fix handrails that are broken or loose. Make sure that handrails are as long as the stairways.  Check any carpeting to make sure that it is firmly attached to the stairs. Fix any carpet that is loose or worn.  Avoid having throw rugs at the top or bottom of the stairs. If you do have throw rugs, attach them to the floor with carpet tape.  Make sure that you have a light switch at the top of the stairs and the bottom of the stairs. If you do not have them, ask someone to add them for you. What else can I do to help prevent falls?  Wear shoes that:  Do not have high heels.  Have rubber bottoms.  Are comfortable and fit you well.  Are closed at the toe. Do not wear sandals.  If you use a  stepladder:  Make sure that it is fully opened. Do not climb a closed stepladder.  Make sure that both sides of the stepladder are locked into place.  Ask someone to hold it for you, if possible.  Clearly mark and make sure that you can see:  Any grab bars or handrails.  First and last steps.  Where the edge of each step is.  Use tools that help you move around (mobility aids) if they are needed. These include:  Canes.  Walkers.  Scooters.  Crutches.  Turn on the lights when you go into a dark area. Replace any light bulbs as soon as they burn out.  Set up your furniture so you have a clear path. Avoid moving your furniture around.  If any of your floors are uneven, fix them.  If there are any pets around you, be aware of where they are.  Review your medicines with your doctor. Some medicines can make you feel dizzy. This can increase your chance of falling. Ask your doctor what other things that you can do to help prevent falls. This information is not intended to replace advice given to you by your health care provider. Make sure you discuss any questions you have with your health care provider. Document Released: 08/04/2009 Document Revised: 03/15/2016 Document Reviewed: 11/12/2014 Elsevier Interactive Patient Education  2017 [employer].

## 2020-11-03 ENCOUNTER — Ambulatory Visit: Payer: Medicare Other

## 2020-11-04 DIAGNOSIS — Z1152 Encounter for screening for COVID-19: Secondary | ICD-10-CM | POA: Diagnosis not present

## 2020-12-15 ENCOUNTER — Ambulatory Visit
Admission: RE | Admit: 2020-12-15 | Discharge: 2020-12-15 | Disposition: A | Discharge status: Home / Self Care | Payer: Medicare Other | Source: Ambulatory Visit | Attending: Family Medicine | Admitting: Family Medicine

## 2020-12-15 ENCOUNTER — Other Ambulatory Visit: Payer: Self-pay

## 2020-12-15 DIAGNOSIS — Z1231 Encounter for screening mammogram for malignant neoplasm of breast: Secondary | ICD-10-CM

## 2021-04-19 ENCOUNTER — Encounter: Payer: Self-pay | Admitting: *Deleted

## 2021-07-19 ENCOUNTER — Other Ambulatory Visit: Payer: Self-pay

## 2021-07-19 ENCOUNTER — Ambulatory Visit (INDEPENDENT_AMBULATORY_CARE_PROVIDER_SITE_OTHER): Payer: Medicare Other | Admitting: Family Medicine

## 2021-07-19 ENCOUNTER — Encounter: Payer: Self-pay | Admitting: Family Medicine

## 2021-07-19 ENCOUNTER — Telehealth (HOSPITAL_BASED_OUTPATIENT_CLINIC_OR_DEPARTMENT_OTHER): Payer: Self-pay

## 2021-07-19 VITALS — BP 110/62 | HR 66 | Temp 97.3°F | Resp 16 | Ht 64.0 in | Wt 156.6 lb

## 2021-07-19 DIAGNOSIS — E663 Overweight: Secondary | ICD-10-CM | POA: Diagnosis not present

## 2021-07-19 DIAGNOSIS — M85852 Other specified disorders of bone density and structure, left thigh: Secondary | ICD-10-CM

## 2021-07-19 DIAGNOSIS — M8580 Other specified disorders of bone density and structure, unspecified site: Secondary | ICD-10-CM | POA: Diagnosis not present

## 2021-07-19 DIAGNOSIS — Z23 Encounter for immunization: Secondary | ICD-10-CM

## 2021-07-19 DIAGNOSIS — E041 Nontoxic single thyroid nodule: Secondary | ICD-10-CM | POA: Diagnosis not present

## 2021-07-19 DIAGNOSIS — K5730 Diverticulosis of large intestine without perforation or abscess without bleeding: Secondary | ICD-10-CM | POA: Insufficient documentation

## 2021-07-19 LAB — HEPATIC FUNCTION PANEL
ALT: 14 U/L (ref 0–35)
AST: 15 U/L (ref 0–37)
Albumin: 4.1 g/dL (ref 3.5–5.2)
Alkaline Phosphatase: 49 U/L (ref 39–117)
Bilirubin, Direct: 0.1 mg/dL (ref 0.0–0.3)
Total Bilirubin: 0.4 mg/dL (ref 0.2–1.2)
Total Protein: 6.7 g/dL (ref 6.0–8.3)

## 2021-07-19 LAB — VITAMIN D 25 HYDROXY (VIT D DEFICIENCY, FRACTURES): VITD: 25.46 ng/mL — ABNORMAL LOW (ref 30.00–100.00)

## 2021-07-19 LAB — CBC WITH DIFFERENTIAL/PLATELET
Basophils Absolute: 0.1 10*3/uL (ref 0.0–0.1)
Basophils Relative: 1.1 % (ref 0.0–3.0)
Eosinophils Absolute: 0.1 10*3/uL (ref 0.0–0.7)
Eosinophils Relative: 2 % (ref 0.0–5.0)
HCT: 38.7 % (ref 36.0–46.0)
Hemoglobin: 13 g/dL (ref 12.0–15.0)
Lymphocytes Relative: 29.6 % (ref 12.0–46.0)
Lymphs Abs: 1.6 10*3/uL (ref 0.7–4.0)
MCHC: 33.7 g/dL (ref 30.0–36.0)
MCV: 92.5 fl (ref 78.0–100.0)
Monocytes Absolute: 0.6 10*3/uL (ref 0.1–1.0)
Monocytes Relative: 10.3 % (ref 3.0–12.0)
Neutro Abs: 3.1 10*3/uL (ref 1.4–7.7)
Neutrophils Relative %: 57 % (ref 43.0–77.0)
Platelets: 277 10*3/uL (ref 150.0–400.0)
RBC: 4.18 Mil/uL (ref 3.87–5.11)
RDW: 13.8 % (ref 11.5–15.5)
WBC: 5.5 10*3/uL (ref 4.0–10.5)

## 2021-07-19 LAB — BASIC METABOLIC PANEL
BUN: 19 mg/dL (ref 6–23)
CO2: 28 mEq/L (ref 19–32)
Calcium: 9.1 mg/dL (ref 8.4–10.5)
Chloride: 106 mEq/L (ref 96–112)
Creatinine, Ser: 0.61 mg/dL (ref 0.40–1.20)
GFR: 89.65 mL/min (ref >60.00)
Glucose, Bld: 66 mg/dL — ABNORMAL LOW (ref 70–99)
Potassium: 4.4 mEq/L (ref 3.5–5.1)
Sodium: 142 mEq/L (ref 135–145)

## 2021-07-19 LAB — LIPID PANEL
Cholesterol: 185 mg/dL (ref 0–200)
HDL: 51.9 mg/dL (ref >39.00)
LDL Cholesterol: 111 mg/dL — ABNORMAL HIGH (ref 0–99)
NonHDL: 132.6
Total CHOL/HDL Ratio: 4
Triglycerides: 107 mg/dL (ref 0.0–149.0)
VLDL: 21.4 mg/dL (ref 0.0–40.0)

## 2021-07-19 LAB — TSH: TSH: 0.97 u[IU]/mL (ref 0.35–5.50)

## 2021-07-19 NOTE — Assessment & Plan Note (Signed)
Noted on last DEXA scan- both hips and L spine.  Not currently on Ca and Vit D.  Repeat imaging and determine treatment plan once results available

## 2021-07-19 NOTE — Patient Instructions (Signed)
Follow up in 1 year or as needed We'll notify you of your lab results and make any changes if needed Continue to work on healthy diet and regular exercise- you're doing great! We'll call you to schedule your bone density and thyroid ultrasound Get your Tetanus shot at the pharmacy Call with any questions or concerns Happy Fall!!

## 2021-07-19 NOTE — Assessment & Plan Note (Signed)
Pt had FNA in 2020 and reports this was negative for malignancy.  She is currently asymptomatic but is worried about possible growth.  Will get repeat US to assess.

## 2021-07-19 NOTE — Assessment & Plan Note (Signed)
Deteriorated.  Pt has gained 6 lbs since last visit.  BMI is 26.88  Encouraged healthy diet and regular exercise.  Will check labs to risk stratify.  Will follow.

## 2021-07-19 NOTE — Progress Notes (Signed)
   Subjective:    Patient ID: Joy Mosley, female    DOB: October 07, 1951, 70 y.o.   MRN: [id number]  HPI Overweight- Pt has gained 6 lbs since last visit.  BMI is 26.88.  Pt has been away recently and admits that diet has not been what it usually is.  Does not eat red meat.  Walks 2x/week.  Has not been to [facility] since COVID.  No CP, SOB, abd pain, N/V.  Thyroid nodule- was following w/ ENT [facility]) and had FNA w/ [facility].  Will re-image to assess for growth.  No pain or swallowing issues.  Osteopenia- pt's last DEXA was 07/09/19.  Due for repeat scan  Health Maintenance- UTD on PNA vaccines, colonoscopy, mammo.  No need for pap   Review of Systems For ROS see HPI   This visit occurred during the SARS-CoV-2 public health emergency.  Safety protocols were in place, including screening questions prior to the visit, additional usage of staff PPE, and extensive cleaning of exam room while observing appropriate contact time as indicated for disinfecting solutions.      Objective:   Physical Exam Vitals reviewed.  Constitutional:      General: She is not in acute distress.    Appearance: Normal appearance. She is well-developed. She is not ill-appearing.  HENT:     Head: Normocephalic and atraumatic.  Eyes:     Conjunctiva/sclera: Conjunctivae normal.     Pupils: Pupils are equal, round, and reactive to light.  Neck:     Thyroid: No thyromegaly.  Cardiovascular:     Rate and Rhythm: Normal rate and regular rhythm.     Pulses: Normal pulses.     Heart sounds: Normal heart sounds. No murmur heard. Pulmonary:     Effort: Pulmonary effort is normal. No respiratory distress.     Breath sounds: Normal breath sounds.  Abdominal:     General: There is no distension.     Palpations: Abdomen is soft.     Tenderness: There is no abdominal tenderness.  Musculoskeletal:     Cervical back: Normal range of motion and neck supple.     Right lower leg: No edema.     Left lower  leg: No edema.  Lymphadenopathy:     Cervical: No cervical adenopathy.  Skin:    General: Skin is warm and dry.  Neurological:     General: No focal deficit present.     Mental Status: She is alert and oriented to person, place, and time.  Psychiatric:        Mood and Affect: Mood normal.        Behavior: Behavior normal.          Assessment & Plan:

## 2021-07-20 ENCOUNTER — Other Ambulatory Visit: Payer: Self-pay

## 2021-07-20 DIAGNOSIS — E559 Vitamin D deficiency, unspecified: Secondary | ICD-10-CM

## 2021-07-20 MED ORDER — VITAMIN D (ERGOCALCIFEROL) 1.25 MG (50000 UNIT) PO CAPS: 50000.0000 [IU] | ORAL_CAPSULE | ORAL | 0 refills | Status: AC

## 2021-08-01 DIAGNOSIS — H40013 Open angle with borderline findings, low risk, bilateral: Secondary | ICD-10-CM | POA: Diagnosis not present

## 2021-08-01 DIAGNOSIS — H10413 Chronic giant papillary conjunctivitis, bilateral: Secondary | ICD-10-CM | POA: Diagnosis not present

## 2021-08-01 DIAGNOSIS — H2513 Age-related nuclear cataract, bilateral: Secondary | ICD-10-CM | POA: Diagnosis not present

## 2021-08-01 DIAGNOSIS — H524 Presbyopia: Secondary | ICD-10-CM | POA: Diagnosis not present

## 2021-08-03 ENCOUNTER — Other Ambulatory Visit: Payer: Self-pay

## 2021-08-03 ENCOUNTER — Ambulatory Visit (HOSPITAL_BASED_OUTPATIENT_CLINIC_OR_DEPARTMENT_OTHER)
Admission: RE | Admit: 2021-08-03 | Discharge: 2021-08-03 | Disposition: A | Discharge status: Home / Self Care | Payer: Medicare Other | Source: Ambulatory Visit | Attending: Family Medicine | Admitting: Family Medicine

## 2021-08-03 DIAGNOSIS — M8580 Other specified disorders of bone density and structure, unspecified site: Secondary | ICD-10-CM

## 2021-08-03 DIAGNOSIS — E042 Nontoxic multinodular goiter: Secondary | ICD-10-CM | POA: Diagnosis not present

## 2021-08-03 DIAGNOSIS — E041 Nontoxic single thyroid nodule: Secondary | ICD-10-CM | POA: Insufficient documentation

## 2021-08-03 DIAGNOSIS — M85852 Other specified disorders of bone density and structure, left thigh: Secondary | ICD-10-CM | POA: Diagnosis not present

## 2021-10-09 ENCOUNTER — Ambulatory Visit: Payer: Medicare Other

## 2021-10-12 ENCOUNTER — Ambulatory Visit (INDEPENDENT_AMBULATORY_CARE_PROVIDER_SITE_OTHER): Payer: Medicare Other

## 2021-10-12 DIAGNOSIS — Z0000 Encounter for general adult medical examination without abnormal findings: Secondary | ICD-10-CM

## 2021-10-12 NOTE — Progress Notes (Signed)
Subjective:   Joy Mosley is a 69 y.o. female who presents for Medicare Annual (Subsequent) preventive examination. I connected with Tawanna Solo today by telephone and verified that I am speaking with the correct person using two identifiers. Location patient: home Location provider: work Persons participating in the virtual visit: patient, provider.   I discussed the limitations, risks, security and privacy concerns of performing an evaluation and management service by telephone and the availability of in person appointments. I also discussed with the patient that there may be a patient responsible charge related to this service. The patient expressed understanding and verbally consented to this telephonic visit.    Interactive audio and video telecommunications were attempted between this provider and patient, however failed, due to patient having technical difficulties OR patient did not have access to video capability.  We continued and completed visit with audio only.      Review of Systems     Cardiac Risk Factors include: advanced age (>50men, >20 women)     Objective:    Today's Vitals   There is no height or weight on file to calculate BMI.  Advanced Directives 10/12/2021 10/03/2020 07/06/2019 11/19/2018 04/06/2016 09/07/2014 08/08/2014  Does Patient Have a Medical Advance Directive? Yes Yes Yes Yes Yes Yes No  Type of Estate agent of [place];Living will Healthcare Power of [place];Living will Healthcare Power of [place];Living will Healthcare Power of [place];Living will Healthcare Power of [place];Living will Healthcare Power of [place];Living will -  Does patient want to make changes to medical advance directive? - - Yes (MAU/Ambulatory/Procedural Areas - Information given) No - Patient declined No - Patient declined - -  Copy of Healthcare Power of Attorney in Chart? No - copy requested No - copy requested No - copy requested No - copy  requested No - copy requested - -    Current Medications (verified) Outpatient Encounter Medications as of 10/12/2021  Medication Sig   Vitamin D, Ergocalciferol, (DRISDOL) 1.25 MG (50000 UNIT) CAPS capsule Take 1 capsule (50,000 Units total) by mouth every 7 (seven) days.   No facility-administered encounter medications on file as of 10/12/2021.    Allergies (verified) Patient has no known allergies.   History: Past Medical History:  Diagnosis Date   Diverticulitis    Past Surgical History:  Procedure Laterality Date   BREAST CYST ASPIRATION     CHOLECYSTECTOMY N/A 11/25/2018   Procedure: LAPAROSCOPIC CHOLECYSTECTOMY WITH  INTRAOPERATIVE CHOLANGIOGRAM;  Surgeon: Kinsinger, De Blanch, MD;  Location: [facility] OR;  Service: General;  Laterality: N/A;   OVARIAN CYST REMOVAL  1980   TONSILLECTOMY AND ADENOIDECTOMY     Family History  Problem Relation Age of Onset   Leukemia Mother    Cancer Father        prostate   Cancer Maternal Grandfather    Breast cancer Sister    Social History   Socioeconomic History   Marital status: Widowed    Spouse name: Not on file   Number of children: Not on file   Years of education: Not on file   Highest education level: Not on file  Occupational History   Not on file  Tobacco Use   Smoking status: Never   Smokeless tobacco: Never  Substance and Sexual Activity   Alcohol use: Yes    Comment: rarely   Drug use: No   Sexual activity: Not on file  Other Topics Concern   Not on file  Social History Narrative   Not on  file   Social Determinants of Health   Financial Resource Strain: Low Risk    Difficulty of Paying Living Expenses: Not hard at all  Food Insecurity: No Food Insecurity   Worried About Programme researcher, broadcasting/film/video in the Last Year: Never true   Barista in the Last Year: Never true  Transportation Needs: No Transportation Needs   Lack of Transportation (Medical): No   Lack of Transportation (Non-Medical): No  Physical  Activity: Insufficiently Active   Days of Exercise per Week: 2 days   Minutes of Exercise per Session: 50 min  Stress: No Stress Concern Present   Feeling of Stress : Not at all  Social Connections: Moderately Isolated   Frequency of Communication with Friends and Family: Twice a week   Frequency of Social Gatherings with Friends and Family: Twice a week   Attends Religious Services: Never   Database administrator or Organizations: Yes   Attends Engineer, structural: More than 4 times per year   Marital Status: Widowed    Tobacco Counseling Counseling given: Not Answered   Clinical Intake:  Pre-visit preparation completed: Yes  Pain : No/denies pain     Nutritional Risks: None Diabetes: No  How often do you need to have someone help you when you read instructions, pamphlets, or other written materials from your doctor or pharmacy?: 1 - Never What is the last grade level you completed in school?: BS  Diabetic?no   Interpreter Needed?: No  Information entered by :: L.Mayara Paulson,LPN   Activities of Daily Living In your present state of health, do you have any difficulty performing the following activities: 10/12/2021 07/19/2021  Hearing? N N  Vision? N N  Difficulty concentrating or making decisions? N N  Walking or climbing stairs? N N  Dressing or bathing? N N  Doing errands, shopping? N N  Preparing Food and eating ? N -  Using the Toilet? N -  In the past six months, have you accidently leaked urine? N -  Do you have problems with loss of bowel control? N -  Managing your Medications? N -  Managing your Finances? N -  Housekeeping or managing your Housekeeping? N -  Some recent data might be hidden    Patient Care Team: Sheliah Hatch, MD as PCP - General  Indicate any recent Medical Services you may have received from other than [facility] providers in the past year (date may be approximate).     Assessment:   This is a routine wellness examination  for Joy Mosley.  Hearing/Vision screen Vision Screening - Comments:: Annual ey exams wears glass  Dietary issues and exercise activities discussed: Current Exercise Habits: Home exercise routine, Type of exercise: walking, Time (Minutes): 45, Frequency (Times/Week): 2, Weekly Exercise (Minutes/Week): 90, Intensity: Mild, Exercise limited by: None identified   Goals Addressed             This Visit's Progress    Patient Stated   On track    Increase activity, drink more water & lose weight       Depression Screen PHQ 2/9 Scores 10/12/2021 10/12/2021 07/19/2021 10/03/2020 07/13/2020 05/26/2020 07/06/2019  PHQ - 2 Score 0 0 0 0 0 0 0  PHQ- 9 Score - - 1 - 0 - 0    Fall Risk Fall Risk  10/12/2021 07/19/2021 10/03/2020 07/13/2020 05/26/2020  Falls in the past year? 0 1 0 0 0  Number falls in past yr: 0 1 0  0 0  Injury with Fall? 0 0 0 0 0  Comment - - - - -  Risk for fall due to : - History of fall(s) - - -  Risk for fall due to: Comment - - - - -  Follow up Falls evaluation completed Falls evaluation completed Falls prevention discussed Falls evaluation completed Falls evaluation completed    FALL RISK PREVENTION PERTAINING TO THE HOME:  Any stairs in or around the home? Yes  If so, are there any without handrails? No  Home free of loose throw rugs in walkways, pet beds, electrical cords, etc? Yes  Adequate lighting in your home to reduce risk of falls? Yes   ASSISTIVE DEVICES UTILIZED TO PREVENT FALLS:  Life alert? No  Use of a cane, walker or w/c? No  Grab bars in the bathroom? No  Shower chair or bench in shower? Yes  Elevated toilet seat or a handicapped toilet? No   Cognitive Function:    Normal cognitive status assessed by direct observation by this Nurse Health Advisor. No abnormalities found.      Immunizations Immunization History  Administered Date(s) Administered   PFIZER(Purple Top)SARS-COV-2 Vaccination 11/11/2019, 12/02/2019, 07/29/2020   Pneumococcal  Conjugate-13 07/06/2019   Pneumococcal Polysaccharide-23 07/13/2020    TDAP status: Due, Education has been provided regarding the importance of this vaccine. Advised may receive this vaccine at local pharmacy or Health Dept. Aware to provide a copy of the vaccination record if obtained from local pharmacy or Health Dept. Verbalized acceptance and understanding.  Flu Vaccine status: Due, Education has been provided regarding the importance of this vaccine. Advised may receive this vaccine at local pharmacy or Health Dept. Aware to provide a copy of the vaccination record if obtained from local pharmacy or Health Dept. Verbalized acceptance and understanding.  Pneumococcal vaccine status: Due, Education has been provided regarding the importance of this vaccine. Advised may receive this vaccine at local pharmacy or Health Dept. Aware to provide a copy of the vaccination record if obtained from local pharmacy or Health Dept. Verbalized acceptance and understanding.  Covid-19 vaccine status: Declined, Education has been provided regarding the importance of this vaccine but patient still declined. Advised may receive this vaccine at local pharmacy or Health Dept.or vaccine clinic. Aware to provide a copy of the vaccination record if obtained from local pharmacy or Health Dept. Verbalized acceptance and understanding.  Qualifies for Shingles Vaccine? Yes   Zostavax completed No   Shingrix Completed?: No.    Education has been provided regarding the importance of this vaccine. Patient has been advised to call insurance company to determine out of pocket expense if they have not yet received this vaccine. Advised may also receive vaccine at local pharmacy or Health Dept. Verbalized acceptance and understanding.  Screening Tests Health Maintenance  Topic Date Due   TETANUS/TDAP  Never done   COVID-19 Vaccine (4 - Booster for Pfizer series) 09/23/2020   INFLUENZA VACCINE  Never done   Zoster Vaccines-  Shingrix (1 of 2) 10/18/2021 (Originally 08/30/1999)   MAMMOGRAM  12/15/2021   COLONOSCOPY (Pts 45-65yrs Insurance coverage will need to be confirmed)  08/02/2026   Pneumonia Vaccine 8+ Years old  Completed   DEXA SCAN  Completed   Hepatitis C Screening  Completed   HPV VACCINES  Aged Out    Health Maintenance  Health Maintenance Due  Topic Date Due   TETANUS/TDAP  Never done   COVID-19 Vaccine (4 - Booster for Pfizer series) 09/23/2020  INFLUENZA VACCINE  Never done    Colorectal cancer screening: Type of screening: Colonoscopy. Completed 08/03/2019. Repeat every 7 years  Mammogram status: Completed 12/15/2020. Repeat every year  Bone Density status: Completed 08/03/2021. Results reflect: Bone density results: OSTEOPENIA. Repeat every 5 years.  Lung Cancer Screening: (Low Dose CT Chest recommended if Age 70-80 years, 30 pack-year currently smoking OR have quit w/in 15years.) does not qualify.   Lung Cancer Screening Referral: n/a  Additional Screening:  Hepatitis C Screening: does not qualify; Completed 11/24/2018  Vision Screening: Recommended annual ophthalmology exams for early detection of glaucoma and other disorders of the eye. Is the patient up to date with their annual eye exam?  Yes  Who is the provider or what is the name of the office in which the patient attends annual eye exams? Dr Cathey Endow  If pt is not established with a provider, would they like to be referred to a provider to establish care? No .   Dental Screening: Recommended annual dental exams for proper oral hygiene  Community Resource Referral / Chronic Care Management: CRR required this visit?  No   CCM required this visit?  No      Plan:     I have personally reviewed and noted the following in the patients chart:   Medical and social history Use of alcohol, tobacco or illicit drugs  Current medications and supplements including opioid prescriptions.  Functional ability and  status Nutritional status Physical activity Advanced directives List of other physicians Hospitalizations, surgeries, and ER visits in previous 12 months Vitals Screenings to include cognitive, depression, and falls Referrals and appointments  In addition, I have reviewed and discussed with patient certain preventive protocols, quality metrics, and best practice recommendations. A written personalized care plan for preventive services as well as general preventive health recommendations were provided to patient.     March Rummage, LPN   44/10/[id number]   Nurse Notes: none

## 2021-10-12 NOTE — Patient Instructions (Signed)
[name] , Thank you for taking time to come for your Medicare Wellness Visit. I appreciate your ongoing commitment to your health goals. Please review the following plan we discussed and let me know if I can assist you in the future.   Screening recommendations/referrals: Colonoscopy: 08/03/2019 Mammogram: 12/15/2020 Bone Density: 08/03/2021 Recommended yearly ophthalmology/optometry visit for glaucoma screening and checkup Recommended yearly dental visit for hygiene and checkup  Vaccinations: Influenza vaccine: declined  Pneumococcal vaccine: declined  Tdap vaccine: due  Shingles vaccine: declined     Advanced directives: will provide copies   Conditions/risks identified: none   Next appointment: none    Preventive Care 65 Years and Older, Female Preventive care refers to lifestyle choices and visits with your health care provider that can promote health and wellness. What does preventive care include? A yearly physical exam. This is also called an annual well check. Dental exams once or twice a year. Routine eye exams. Ask your health care provider how often you should have your eyes checked. Personal lifestyle choices, including: Daily care of your teeth and gums. Regular physical activity. Eating a healthy diet. Avoiding tobacco and drug use. Limiting alcohol use. Practicing safe sex. Taking low-dose aspirin every day. Taking vitamin and mineral supplements as recommended by your health care provider. What happens during an annual well check? The services and screenings done by your health care provider during your annual well check will depend on your age, overall health, lifestyle risk factors, and family history of disease. Counseling  Your health care provider may ask you questions about your: Alcohol use. Tobacco use. Drug use. Emotional well-being. Home and relationship well-being. Sexual activity. Eating habits. History of falls. Memory and ability to  understand (cognition). Work and work Astronomer. Reproductive health. Screening  You may have the following tests or measurements: Height, weight, and BMI. Blood pressure. Lipid and cholesterol levels. These may be checked every 5 years, or more frequently if you are over 66 years old. Skin check. Lung cancer screening. You may have this screening every year starting at age 37 if you have a 30-pack-year history of smoking and currently smoke or have quit within the past 15 years. Fecal occult blood test (FOBT) of the stool. You may have this test every year starting at age 81. Flexible sigmoidoscopy or colonoscopy. You may have a sigmoidoscopy every 5 years or a colonoscopy every 10 years starting at age 34. Hepatitis C blood test. Hepatitis B blood test. Sexually transmitted disease (STD) testing. Diabetes screening. This is done by checking your blood sugar (glucose) after you have not eaten for a while (fasting). You may have this done every 1-3 years. Bone density scan. This is done to screen for osteoporosis. You may have this done starting at age 56. Mammogram. This may be done every 1-2 years. Talk to your health care provider about how often you should have regular mammograms. Talk with your health care provider about your test results, treatment options, and if necessary, the need for more tests. Vaccines  Your health care provider may recommend certain vaccines, such as: Influenza vaccine. This is recommended every year. Tetanus, diphtheria, and acellular pertussis (Tdap, Td) vaccine. You may need a Td booster every 10 years. Zoster vaccine. You may need this after age 32. Pneumococcal 13-valent conjugate (PCV13) vaccine. One dose is recommended after age 43. Pneumococcal polysaccharide (PPSV23) vaccine. One dose is recommended after age 57. Talk to your health care provider about which screenings and vaccines you need and  how often you need them. This information is not  intended to replace advice given to you by your health care provider. Make sure you discuss any questions you have with your health care provider. Document Released: 11/04/2015 Document Revised: 06/27/2016 Document Reviewed: 08/09/2015 Elsevier Interactive Patient Education  2017 [place] Prevention in the Home Falls can cause injuries. They can happen to people of all ages. There are many things you can do to make your home safe and to help prevent falls. What can I do on the outside of my home? Regularly fix the edges of walkways and driveways and fix any cracks. Remove anything that might make you trip as you walk through a door, such as a raised step or threshold. Trim any bushes or trees on the path to your home. Use bright outdoor lighting. Clear any walking paths of anything that might make someone trip, such as rocks or tools. Regularly check to see if handrails are loose or broken. Make sure that both sides of any steps have handrails. Any raised decks and porches should have guardrails on the edges. Have any leaves, snow, or ice cleared regularly. Use sand or salt on walking paths during winter. Clean up any spills in your garage right away. This includes oil or grease spills. What can I do in the bathroom? Use night lights. Install grab bars by the toilet and in the tub and shower. Do not use towel bars as grab bars. Use non-skid mats or decals in the tub or shower. If you need to sit down in the shower, use a plastic, non-slip stool. Keep the floor dry. Clean up any water that spills on the floor as soon as it happens. Remove soap buildup in the tub or shower regularly. Attach bath mats securely with double-sided non-slip rug tape. Do not have throw rugs and other things on the floor that can make you trip. What can I do in the bedroom? Use night lights. Make sure that you have a light by your bed that is easy to reach. Do not use any sheets or blankets that are  too big for your bed. They should not hang down onto the floor. Have a firm chair that has side arms. You can use this for support while you get dressed. Do not have throw rugs and other things on the floor that can make you trip. What can I do in the kitchen? Clean up any spills right away. Avoid walking on wet floors. Keep items that you use a lot in easy-to-reach places. If you need to reach something above you, use a strong step stool that has a grab bar. Keep electrical cords out of the way. Do not use floor polish or wax that makes floors slippery. If you must use wax, use non-skid floor wax. Do not have throw rugs and other things on the floor that can make you trip. What can I do with my stairs? Do not leave any items on the stairs. Make sure that there are handrails on both sides of the stairs and use them. Fix handrails that are broken or loose. Make sure that handrails are as long as the stairways. Check any carpeting to make sure that it is firmly attached to the stairs. Fix any carpet that is loose or worn. Avoid having throw rugs at the top or bottom of the stairs. If you do have throw rugs, attach them to the floor with carpet tape. Make sure that you have  a light switch at the top of the stairs and the bottom of the stairs. If you do not have them, ask someone to add them for you. What else can I do to help prevent falls? Wear shoes that: Do not have high heels. Have rubber bottoms. Are comfortable and fit you well. Are closed at the toe. Do not wear sandals. If you use a stepladder: Make sure that it is fully opened. Do not climb a closed stepladder. Make sure that both sides of the stepladder are locked into place. Ask someone to hold it for you, if possible. Clearly mark and make sure that you can see: Any grab bars or handrails. First and last steps. Where the edge of each step is. Use tools that help you move around (mobility aids) if they are needed. These  include: Canes. Walkers. Scooters. Crutches. Turn on the lights when you go into a dark area. Replace any light bulbs as soon as they burn out. Set up your furniture so you have a clear path. Avoid moving your furniture around. If any of your floors are uneven, fix them. If there are any pets around you, be aware of where they are. Review your medicines with your doctor. Some medicines can make you feel dizzy. This can increase your chance of falling. Ask your doctor what other things that you can do to help prevent falls. This information is not intended to replace advice given to you by your health care provider. Make sure you discuss any questions you have with your health care provider. Document Released: 08/04/2009 Document Revised: 03/15/2016 Document Reviewed: 11/12/2014 Elsevier Interactive Patient Education  2017 [employer].

## 2021-11-13 ENCOUNTER — Other Ambulatory Visit: Payer: Self-pay | Admitting: Family Medicine

## 2021-11-13 DIAGNOSIS — Z1231 Encounter for screening mammogram for malignant neoplasm of breast: Secondary | ICD-10-CM

## 2021-12-19 ENCOUNTER — Ambulatory Visit: Payer: Medicare Other

## 2021-12-21 ENCOUNTER — Ambulatory Visit
Admission: RE | Admit: 2021-12-21 | Discharge: 2021-12-21 | Disposition: A | Discharge status: Home / Self Care | Payer: Medicare Other | Source: Ambulatory Visit | Attending: Family Medicine | Admitting: Family Medicine

## 2021-12-21 DIAGNOSIS — Z1231 Encounter for screening mammogram for malignant neoplasm of breast: Secondary | ICD-10-CM

## 2022-07-02 ENCOUNTER — Ambulatory Visit (INDEPENDENT_AMBULATORY_CARE_PROVIDER_SITE_OTHER): Payer: Medicare Other | Admitting: Family Medicine

## 2022-07-02 ENCOUNTER — Other Ambulatory Visit: Payer: Self-pay

## 2022-07-02 ENCOUNTER — Encounter: Payer: Self-pay | Admitting: Family Medicine

## 2022-07-02 VITALS — BP 114/64 | HR 66 | Temp 98.1°F | Resp 18 | Ht 61.0 in | Wt 161.4 lb

## 2022-07-02 DIAGNOSIS — R490 Dysphonia: Secondary | ICD-10-CM | POA: Diagnosis not present

## 2022-07-02 DIAGNOSIS — E559 Vitamin D deficiency, unspecified: Secondary | ICD-10-CM | POA: Diagnosis not present

## 2022-07-02 DIAGNOSIS — E669 Obesity, unspecified: Secondary | ICD-10-CM

## 2022-07-02 DIAGNOSIS — E041 Nontoxic single thyroid nodule: Secondary | ICD-10-CM | POA: Diagnosis not present

## 2022-07-02 DIAGNOSIS — Z23 Encounter for immunization: Secondary | ICD-10-CM

## 2022-07-02 LAB — LIPID PANEL
Cholesterol: 161 mg/dL (ref 0–200)
HDL: 50.2 mg/dL (ref >39.00)
LDL Cholesterol: 91 mg/dL (ref 0–99)
NonHDL: 110.45
Total CHOL/HDL Ratio: 3
Triglycerides: 96 mg/dL (ref 0.0–149.0)
VLDL: 19.2 mg/dL (ref 0.0–40.0)

## 2022-07-02 LAB — CBC WITH DIFFERENTIAL/PLATELET
Basophils Absolute: 0.1 10*3/uL (ref 0.0–0.1)
Basophils Relative: 0.9 % (ref 0.0–3.0)
Eosinophils Absolute: 0.1 10*3/uL (ref 0.0–0.7)
Eosinophils Relative: 1.6 % (ref 0.0–5.0)
HCT: 38.2 % (ref 36.0–46.0)
Hemoglobin: 12.8 g/dL (ref 12.0–15.0)
Lymphocytes Relative: 32.5 % (ref 12.0–46.0)
Lymphs Abs: 2.1 10*3/uL (ref 0.7–4.0)
MCHC: 33.5 g/dL (ref 30.0–36.0)
MCV: 91.5 fl (ref 78.0–100.0)
Monocytes Absolute: 0.6 10*3/uL (ref 0.1–1.0)
Monocytes Relative: 9.3 % (ref 3.0–12.0)
Neutro Abs: 3.6 10*3/uL (ref 1.4–7.7)
Neutrophils Relative %: 55.7 % (ref 43.0–77.0)
Platelets: 253 10*3/uL (ref 150.0–400.0)
RBC: 4.17 Mil/uL (ref 3.87–5.11)
RDW: 13.5 % (ref 11.5–15.5)
WBC: 6.5 10*3/uL (ref 4.0–10.5)

## 2022-07-02 LAB — BASIC METABOLIC PANEL
BUN: 17 mg/dL (ref 6–23)
CO2: 28 mEq/L (ref 19–32)
Calcium: 8.9 mg/dL (ref 8.4–10.5)
Chloride: 106 mEq/L (ref 96–112)
Creatinine, Ser: 0.85 mg/dL (ref 0.40–1.20)
GFR: 68.24 mL/min (ref >60.00)
Glucose, Bld: 82 mg/dL (ref 70–99)
Potassium: 4.3 mEq/L (ref 3.5–5.1)
Sodium: 140 mEq/L (ref 135–145)

## 2022-07-02 LAB — HEPATIC FUNCTION PANEL
ALT: 14 U/L (ref 0–35)
AST: 18 U/L (ref 0–37)
Albumin: 3.8 g/dL (ref 3.5–5.2)
Alkaline Phosphatase: 47 U/L (ref 39–117)
Bilirubin, Direct: 0.1 mg/dL (ref 0.0–0.3)
Total Bilirubin: 0.5 mg/dL (ref 0.2–1.2)
Total Protein: 6.6 g/dL (ref 6.0–8.3)

## 2022-07-02 LAB — VITAMIN D 25 HYDROXY (VIT D DEFICIENCY, FRACTURES): VITD: 23.32 ng/mL — ABNORMAL LOW (ref 30.00–100.00)

## 2022-07-02 LAB — TSH: TSH: 1.3 u[IU]/mL (ref 0.35–5.50)

## 2022-07-02 NOTE — Assessment & Plan Note (Signed)
Check labs and replete prn. 

## 2022-07-02 NOTE — Assessment & Plan Note (Signed)
Known thyroid nodule.  It was recommended to have yearly [place] surveillance x5 yrs.  Repeat US ordered.  Currently asymptomatic.

## 2022-07-02 NOTE — Assessment & Plan Note (Signed)
Deteriorated.  Pt's voice is more raspy than before.  Saw ENT previously but he was not helpful.  Will re-refer for complete evaluation since sxs are worsening.  Pt expressed understanding and is in agreement w/ plan.

## 2022-07-02 NOTE — Progress Notes (Signed)
   Subjective:    Patient ID: Joy Mosley, female    DOB: 21-Apr-1951, 71 y.o.   MRN: [id number]  HPI Obesity- pt has gained 5 lbs which pushed her BMI to 30.5  Pt is frustrated w/ weight gain.  Would like to get back down to 125 lbs.  Pt does a 45 minute exercise class twice weekly.  No CP, SOB, abd pain, N/V.  Thyroid nodule- due for repeat US.  [place] last year showed nodule and they recommended yearly f/u x5 yrs.  Pt is frustrated w/ weight gain.  Denies changes in energy level.  No changes to skin/hair/nails.  Osteopenia- UTD on DEXA.  Due for repeat Vit D as she was deficient last year.  Hoarseness- pt reports sxs are worsening and people are commenting on it.  No pain.   Review of Systems For ROS see HPI     Objective:   Physical Exam Vitals reviewed.  Constitutional:      General: She is not in acute distress.    Appearance: Normal appearance. She is well-developed. She is not ill-appearing.  HENT:     Head: Normocephalic and atraumatic.     Mouth/Throat:     Mouth: Mucous membranes are moist.     Pharynx: Oropharynx is clear. No oropharyngeal exudate or posterior oropharyngeal erythema.  Eyes:     Conjunctiva/sclera: Conjunctivae normal.     Pupils: Pupils are equal, round, and reactive to light.  Neck:     Thyroid: No thyromegaly.  Cardiovascular:     Rate and Rhythm: Normal rate and regular rhythm.     Pulses: Normal pulses.     Heart sounds: Normal heart sounds. No murmur heard. Pulmonary:     Effort: Pulmonary effort is normal. No respiratory distress.     Breath sounds: Normal breath sounds.  Abdominal:     General: There is no distension.     Palpations: Abdomen is soft.     Tenderness: There is no abdominal tenderness.  Musculoskeletal:     Cervical back: Normal range of motion and neck supple.     Right lower leg: No edema.     Left lower leg: No edema.  Lymphadenopathy:     Cervical: No cervical adenopathy.  Skin:    General: Skin is warm and dry.   Neurological:     General: No focal deficit present.     Mental Status: She is alert and oriented to person, place, and time.  Psychiatric:        Mood and Affect: Mood normal.        Behavior: Behavior normal.        Thought Content: Thought content normal.           Assessment & Plan:

## 2022-07-02 NOTE — Assessment & Plan Note (Signed)
New.  Pt's BMI now 30.5 after gaining 5 lbs.  Encouraged low carb diet and regular exercise.  Will check labs to risk stratify.

## 2022-07-02 NOTE — Patient Instructions (Signed)
Follow up in 1 year or as needed We'll notify you of your lab results and make any changes if needed We'll call you to schedule your thyroid ultrasound and the ENT appt Make sure you are eating a low carb diet and getting regular exercise- you can do it! Call with any questions or concerns Stay Safe!  Stay Healthy! Happy Fall!!!

## 2022-07-04 ENCOUNTER — Ambulatory Visit (HOSPITAL_BASED_OUTPATIENT_CLINIC_OR_DEPARTMENT_OTHER)
Admission: RE | Admit: 2022-07-04 | Discharge: 2022-07-04 | Disposition: A | Discharge status: Home / Self Care | Payer: Medicare Other | Source: Ambulatory Visit | Attending: Family Medicine | Admitting: Family Medicine

## 2022-07-04 DIAGNOSIS — E041 Nontoxic single thyroid nodule: Secondary | ICD-10-CM | POA: Insufficient documentation

## 2022-07-04 DIAGNOSIS — E042 Nontoxic multinodular goiter: Secondary | ICD-10-CM | POA: Diagnosis not present

## 2022-08-07 DIAGNOSIS — H524 Presbyopia: Secondary | ICD-10-CM | POA: Diagnosis not present

## 2022-08-07 DIAGNOSIS — H2513 Age-related nuclear cataract, bilateral: Secondary | ICD-10-CM | POA: Diagnosis not present

## 2022-09-17 DIAGNOSIS — R490 Dysphonia: Secondary | ICD-10-CM | POA: Diagnosis not present

## 2022-09-17 DIAGNOSIS — K219 Gastro-esophageal reflux disease without esophagitis: Secondary | ICD-10-CM | POA: Diagnosis not present

## 2022-09-17 DIAGNOSIS — E042 Nontoxic multinodular goiter: Secondary | ICD-10-CM | POA: Diagnosis not present

## 2022-11-21 ENCOUNTER — Other Ambulatory Visit: Payer: Self-pay | Admitting: Family Medicine

## 2022-11-21 ENCOUNTER — Telehealth: Payer: Self-pay | Admitting: Family Medicine

## 2022-11-21 DIAGNOSIS — Z1231 Encounter for screening mammogram for malignant neoplasm of breast: Secondary | ICD-10-CM

## 2022-11-21 NOTE — Telephone Encounter (Signed)
Left message for patient to call back and schedule Medicare Annual Wellness Visit (AWV).   Please offer to do virtually or by telephone.  Left office number and my jabber [phone].  Last AWV:10/12/2021   Please schedule at anytime with Nurse Health Advisor.

## 2022-12-13 ENCOUNTER — Ambulatory Visit (INDEPENDENT_AMBULATORY_CARE_PROVIDER_SITE_OTHER): Payer: Medicare Other

## 2022-12-13 VITALS — Ht 64.5 in | Wt 157.0 lb

## 2022-12-13 DIAGNOSIS — Z0000 Encounter for general adult medical examination without abnormal findings: Secondary | ICD-10-CM

## 2022-12-13 NOTE — Progress Notes (Signed)
Subjective:   Joy Mosley is a 72 y.o. female who presents for Medicare Annual (Subsequent) preventive examination.  Review of Systems    Virtual Visit via Telephone Note  I connected with  Joy Mosley on 12/13/22 at  9:15 AM EST by telephone and verified that I am speaking with the correct person using two identifiers.  Location: Patient: Home Provider: Office Persons participating in the virtual visit: patient/Nurse Health Advisor   I discussed the limitations, risks, security and privacy concerns of performing an evaluation and management service by telephone and the availability of in person appointments. The patient expressed understanding and agreed to proceed.  Interactive audio and video telecommunications were attempted between this nurse and patient, however failed, due to patient having technical difficulties OR patient did not have access to video capability.  We continued and completed visit with audio only.  Some vital signs may be absent or patient reported.   Criselda Peaches, LPN  Cardiac Risk Factors include: advanced age (>35mn, >>41women)     Objective:    Today's Vitals   12/13/22 0916  Weight: 157 lb (71.2 kg)  Height: 5' 4.5" (1.638 m)   Body mass index is 26.53 kg/m.     12/13/2022    9:24 AM 10/12/2021    9:52 AM 10/03/2020    3:48 PM 07/06/2019   11:06 AM 11/19/2018   11:14 PM 04/06/2016    9:38 AM 09/07/2014    9:21 AM  Advanced Directives  Does Patient Have a Medical Advance Directive? Yes Yes Yes Yes Yes Yes Yes  Type of AParamedicof AAlmyraLiving will HEdgertonLiving will HTexarkanaLiving will HCenturyLiving will HDeweeseLiving will HInolaLiving will HDoyleLiving will  Does patient want to make changes to medical advance directive?    Yes (MAU/Ambulatory/Procedural Areas -  Information given) No - Patient declined No - Patient declined   Copy of HRavennain Chart? No - copy requested No - copy requested No - copy requested No - copy requested No - copy requested No - copy requested     Current Medications (verified) Outpatient Encounter Medications as of 12/13/2022  Medication Sig   Vitamin D, Ergocalciferol, (DRISDOL) 1.25 MG (50000 UNIT) CAPS capsule Take 1 capsule (50,000 Units total) by mouth every 7 (seven) days.   No facility-administered encounter medications on file as of 12/13/2022.    Allergies (verified) Patient has no known allergies.   History: Past Medical History:  Diagnosis Date   Diverticulitis    Past Surgical History:  Procedure Laterality Date   BREAST CYST ASPIRATION     CHOLECYSTECTOMY N/A 11/25/2018   Procedure: LAPAROSCOPIC CHOLECYSTECTOMY WITH  INTRAOPERATIVE CHOLANGIOGRAM;  Surgeon: Kinsinger, LArta Bruce MD;  [facility]  Service: General;  Laterality: N/A;   OVARIAN CYST REMOVAL  1980   TONSILLECTOMY AND ADENOIDECTOMY     Family History  Problem Relation Age of Onset   Leukemia Mother    Cancer Father        prostate   Cancer Maternal Grandfather    Breast cancer Sister    Social History   Socioeconomic History   Marital status: Widowed    Spouse name: Not on file   Number of children: Not on file   Years of education: Not on file   Highest education level: Not on file  Occupational History   Not  on file  Tobacco Use   Smoking status: Never   Smokeless tobacco: Never  Substance and Sexual Activity   Alcohol use: Yes    Comment: rarely   Drug use: No   Sexual activity: Not on file  Other Topics Concern   Not on file  Social History Narrative   Not on file   Social Determinants of Health   Financial Resource Strain: Low Risk  (12/13/2022)   Overall Financial Resource Strain (CARDIA)    Difficulty of Paying Living Expenses: Not hard at all  Food Insecurity: No Food Insecurity  (12/13/2022)   Hunger Vital Sign    Worried About Running Out of Food in the Last Year: Never true    Ran Out of Food in the Last Year: Never true  Transportation Needs: No Transportation Needs (10/12/2021)   PRAPARE - Hydrologist (Medical): No    Lack of Transportation (Non-Medical): No  Physical Activity: Sufficiently Active (12/13/2022)   Exercise Vital Sign    Days of Exercise per Week: 4 days    Minutes of Exercise per Session: 40 min  Stress: No Stress Concern Present (12/13/2022)   Stephen    Feeling of Stress : Not at all  Social Connections: Moderately Integrated (12/13/2022)   Social Connection and Isolation Panel [NHANES]    Frequency of Communication with Friends and Family: More than three times a week    Frequency of Social Gatherings with Friends and Family: More than three times a week    Attends Religious Services: More than 4 times per year    Active Member of [employer] or Organizations: Yes    Attends Archivist Meetings: More than 4 times per year    Marital Status: Widowed    Tobacco Counseling Counseling given: Not Answered   Clinical Intake:  Pre-visit preparation completed: Yes  Pain : No/denies pain     BMI - recorded: 26.53 Nutritional Status: BMI 25 -29 Overweight Nutritional Risks: None Diabetes: No  How often do you need to have someone help you when you read instructions, pamphlets, or other written materials from your doctor or pharmacy?: 1 - Never  Diabetic? No  Interpreter Needed?: No  Information entered by :: Rolene Arbour LPN   Activities of Daily Living    12/13/2022    9:23 AM 12/12/2022    4:12 PM  In your present state of health, do you have any difficulty performing the following activities:  Hearing? 0 0  Vision? 0 0  Difficulty concentrating or making decisions? 0 0  Walking or climbing stairs? 0 0  Dressing or  bathing? 0 0  Doing errands, shopping? 0 0  Preparing Food and eating ? N N  Using the Toilet? N N  In the past six months, have you accidently leaked urine? N N  Do you have problems with loss of bowel control? N N  Managing your Medications? N N  Managing your Finances? N N  Housekeeping or managing your Housekeeping? N N    Patient Care Team: Midge Minium, MD as PCP - General  Indicate any recent Medical Services you may have received from other than [facility] providers in the past year (date may be approximate).     Assessment:   This is a routine wellness examination for [place].  Hearing/Vision screen Hearing Screening - Comments:: Denies hearing difficulties   Vision Screening - Comments:: Wears rx glasses -  up to date with routine eye exams with  Dr Twanna Hy  Dietary issues and exercise activities discussed: Exercise limited by: None identified   Goals Addressed               This Visit's Progress     Patient Stated (pt-stated)        Increase activity, drink more water & lose weigh.       Depression Screen    12/13/2022    9:22 AM 07/02/2022    1:41 PM 10/12/2021    9:54 AM 10/12/2021    9:50 AM 07/19/2021   10:05 AM 10/03/2020    3:50 PM 07/13/2020    9:40 AM  PHQ 2/9 Scores  PHQ - 2 Score 0 0 0 0 0 0 0  PHQ- 9 Score  1   1  0    Fall Risk    12/13/2022    9:23 AM 12/12/2022    4:12 PM 07/02/2022    1:41 PM 10/12/2021    9:52 AM 07/19/2021   10:05 AM  Fall Risk   Falls in the past year? 0 0 0 0 1  Number falls in past yr: 0 0 0 0 1  Injury with Fall? 0 0 0 0 0  Risk for fall due to : No Fall Risks  No Fall Risks  History of fall(s)  Follow up Falls prevention discussed  Falls evaluation completed Falls evaluation completed Falls evaluation completed    [place]:  Any stairs in or around the home? Yes  If so, are there any without handrails? No  Home free of loose throw rugs in walkways, pet beds, electrical  cords, etc? Yes  Adequate lighting in your home to reduce risk of falls? Yes   ASSISTIVE DEVICES UTILIZED TO PREVENT FALLS:  Life alert? No  Use of a cane, walker or w/c? No  Grab bars in the bathroom? No  Shower chair or bench in shower? Yes Elevated toilet seat or a handicapped toilet? No   TIMED UP AND GO:  Was the test performed? No . Audio Visit   Cognitive Function:        12/13/2022    9:24 AM  6CIT Screen  What Year? 0 points  What month? 0 points  What time? 0 points  Count back from 20 0 points  Months in reverse 0 points  Repeat phrase 0 points  Total Score 0 points    Immunizations Immunization History  Administered Date(s) Administered   Fluad Quad(high Dose 65+) 07/02/2022   PFIZER(Purple Top)SARS-COV-2 Vaccination 11/11/2019, 12/02/2019, 07/29/2020   Pneumococcal Conjugate-13 07/06/2019   Pneumococcal Polysaccharide-23 07/13/2020    TDAP status: Due, Education has been provided regarding the importance of this vaccine. Advised may receive this vaccine at local pharmacy or Health Dept. Aware to provide a copy of the vaccination record if obtained from local pharmacy or Health Dept. Verbalized acceptance and understanding.  Flu Vaccine status: Completed at today's visit  Pneumococcal vaccine status: Up to date  Covid-19 vaccine status: Completed vaccines  Qualifies for Shingles Vaccine? Yes   Zostavax completed No   Shingrix Completed?: No.    Education has been provided regarding the importance of this vaccine. Patient has been advised to call insurance company to determine out of pocket expense if they have not yet received this vaccine. Advised may also receive vaccine at local pharmacy or Health Dept. Verbalized acceptance and understanding.  Screening Tests Health Maintenance  Topic Date Due   DTaP/Tdap/Td (1 - Tdap) Never done   COVID-19 Vaccine (4 - 2023-24 season) 12/29/2022 (Originally 06/22/2022)   Zoster Vaccines- Shingrix (1 of 2)  03/13/2023 (Originally 08/30/1999)   MAMMOGRAM  12/22/2022   Medicare Annual Wellness (AWV)  12/14/2023   COLONOSCOPY (Pts 45-39yr Insurance coverage will need to be confirmed)  08/02/2026   Pneumonia Vaccine 72 Years old  Completed   INFLUENZA VACCINE  Completed   DEXA SCAN  Completed   Hepatitis C Screening  Completed   HPV VACCINES  Aged Out    Health Maintenance  Health Maintenance Due  Topic Date Due   DTaP/Tdap/Td (1 - Tdap) Never done    Colorectal cancer screening: Type of screening: Colonoscopy. Completed 08/03/19. Repeat every 7 years  Mammogram status: Ordered Scheduled for 01/09/23. Pt provided with contact info and advised to call to schedule appt.   Bone Density status: Completed 08/03/21. Results reflect: Bone density results: OSTEOPOROSIS. Repeat every   years.  Lung Cancer Screening: (Low Dose CT Chest recommended if Age 74-80years, 30 pack-year currently smoking OR have quit w/in 15years.) does not qualify.     Additional Screening:  Hepatitis C Screening: does qualify; Completed    Vision Screening: Recommended annual ophthalmology exams for early detection of glaucoma and other disorders of the eye. Is the patient up to date with their annual eye exam?  Yes  Who is the provider or what is the name of the office in which the patient attends annual eye exams? Dr BTwanna HyIf pt is not established with a provider, would they like to be referred to a provider to establish care? No .   Dental Screening: Recommended annual dental exams for proper oral hygiene  Community Resource Referral / Chronic Care Management:  CRR required this visit?  No   CCM required this visit?  No      Plan:     I have personally reviewed and noted the following in the patient's chart:   Medical and social history Use of alcohol, tobacco or illicit drugs  Current medications and supplements including opioid prescriptions. Patient is not currently taking opioid  prescriptions. Functional ability and status Nutritional status Physical activity Advanced directives List of other physicians Hospitalizations, surgeries, and ER visits in previous 12 months Vitals Screenings to include cognitive, depression, and falls Referrals and appointments  In addition, I have reviewed and discussed with patient certain preventive protocols, quality metrics, and best practice recommendations. A written personalized care plan for preventive services as well as general preventive health recommendations were provided to patient.     BCriselda Peaches LPN   [id number]  Nurse Notes: None

## 2022-12-13 NOTE — Patient Instructions (Addendum)
[name] , Thank you for taking time to come for your Medicare Wellness Visit. I appreciate your ongoing commitment to your health goals. Please review the following plan we discussed and let me know if I can assist you in the future.   These are the goals we discussed:  Goals       Patient Stated (pt-stated)      Increase activity, drink more water & lose weigh.        This is a list of the screening recommended for you and due dates:  Health Maintenance  Topic Date Due   DTaP/Tdap/Td vaccine (1 - Tdap) Never done   COVID-19 Vaccine (4 - 2023-24 season) 12/29/2022*   Zoster (Shingles) Vaccine (1 of 2) 03/13/2023*   Mammogram  12/22/2022   Medicare Annual Wellness Visit  12/14/2023   Colon Cancer Screening  08/02/2026   Pneumonia Vaccine  Completed   Flu Shot  Completed   DEXA scan (bone density measurement)  Completed   Hepatitis C Screening: USPSTF Recommendation to screen - Ages 18-79 yo.  Completed   HPV Vaccine  Aged Out  *Topic was postponed. The date shown is not the original due date.    Advanced directives: Please bring a copy of your health care power of attorney and living will to the office to be added to your chart at your convenience.   Conditions/risks identified: None  Next appointment: Follow up in one year for your annual wellness visit     Preventive Care 65 Years and Older, Female Preventive care refers to lifestyle choices and visits with your health care provider that can promote health and wellness. What does preventive care include? A yearly physical exam. This is also called an annual well check. Dental exams once or twice a year. Routine eye exams. Ask your health care provider how often you should have your eyes checked. Personal lifestyle choices, including: Daily care of your teeth and gums. Regular physical activity. Eating a healthy diet. Avoiding tobacco and drug use. Limiting alcohol use. Practicing safe sex. Taking low-dose aspirin  every day. Taking vitamin and mineral supplements as recommended by your health care provider. What happens during an annual well check? The services and screenings done by your health care provider during your annual well check will depend on your age, overall health, lifestyle risk factors, and family history of disease. Counseling  Your health care provider may ask you questions about your: Alcohol use. Tobacco use. Drug use. Emotional well-being. Home and relationship well-being. Sexual activity. Eating habits. History of falls. Memory and ability to understand (cognition). Work and work Statistician. Reproductive health. Screening  You may have the following tests or measurements: Height, weight, and BMI. Blood pressure. Lipid and cholesterol levels. These may be checked every 5 years, or more frequently if you are over 75 years old. Skin check. Lung cancer screening. You may have this screening every year starting at age 90 if you have a 30-pack-year history of smoking and currently smoke or have quit within the past 15 years. Fecal occult blood test (FOBT) of the stool. You may have this test every year starting at age 27. Flexible sigmoidoscopy or colonoscopy. You may have a sigmoidoscopy every 5 years or a colonoscopy every 10 years starting at age 50. Hepatitis C blood test. Hepatitis B blood test. Sexually transmitted disease (STD) testing. Diabetes screening. This is done by checking your blood sugar (glucose) after you have not eaten for a while (fasting). You may have  this done every 1-3 years. Bone density scan. This is done to screen for osteoporosis. You may have this done starting at age 37. Mammogram. This may be done every 1-2 years. Talk to your health care provider about how often you should have regular mammograms. Talk with your health care provider about your test results, treatment options, and if necessary, the need for more tests. Vaccines  Your health  care provider may recommend certain vaccines, such as: Influenza vaccine. This is recommended every year. Tetanus, diphtheria, and acellular pertussis (Tdap, Td) vaccine. You may need a Td booster every 10 years. Zoster vaccine. You may need this after age 73. Pneumococcal 13-valent conjugate (PCV13) vaccine. One dose is recommended after age 72. Pneumococcal polysaccharide (PPSV23) vaccine. One dose is recommended after age 64. Talk to your health care provider about which screenings and vaccines you need and how often you need them. This information is not intended to replace advice given to you by your health care provider. Make sure you discuss any questions you have with your health care provider. Document Released: 11/04/2015 Document Revised: 06/27/2016 Document Reviewed: 08/09/2015 Elsevier Interactive Patient Education  2017 [employer] Prevention in the Home Falls can cause injuries. They can happen to people of all ages. There are many things you can do to make your home safe and to help prevent falls. What can I do on the outside of my home? Regularly fix the edges of walkways and driveways and fix any cracks. Remove anything that might make you trip as you walk through a door, such as a raised step or threshold. Trim any bushes or trees on the path to your home. Use bright outdoor lighting. Clear any walking paths of anything that might make someone trip, such as rocks or tools. Regularly check to see if handrails are loose or broken. Make sure that both sides of any steps have handrails. Any raised decks and porches should have guardrails on the edges. Have any leaves, snow, or ice cleared regularly. Use sand or salt on walking paths during winter. Clean up any spills in your garage right away. This includes oil or grease spills. What can I do in the bathroom? Use night lights. Install grab bars by the toilet and in the tub and shower. Do not use towel bars as grab  bars. Use non-skid mats or decals in the tub or shower. If you need to sit down in the shower, use a plastic, non-slip stool. Keep the floor dry. Clean up any water that spills on the floor as soon as it happens. Remove soap buildup in the tub or shower regularly. Attach bath mats securely with double-sided non-slip rug tape. Do not have throw rugs and other things on the floor that can make you trip. What can I do in the bedroom? Use night lights. Make sure that you have a light by your bed that is easy to reach. Do not use any sheets or blankets that are too big for your bed. They should not hang down onto the floor. Have a firm chair that has side arms. You can use this for support while you get dressed. Do not have throw rugs and other things on the floor that can make you trip. What can I do in the kitchen? Clean up any spills right away. Avoid walking on wet floors. Keep items that you use a lot in easy-to-reach places. If you need to reach something above you, use a strong step stool  that has a grab bar. Keep electrical cords out of the way. Do not use floor polish or wax that makes floors slippery. If you must use wax, use non-skid floor wax. Do not have throw rugs and other things on the floor that can make you trip. What can I do with my stairs? Do not leave any items on the stairs. Make sure that there are handrails on both sides of the stairs and use them. Fix handrails that are broken or loose. Make sure that handrails are as long as the stairways. Check any carpeting to make sure that it is firmly attached to the stairs. Fix any carpet that is loose or worn. Avoid having throw rugs at the top or bottom of the stairs. If you do have throw rugs, attach them to the floor with carpet tape. Make sure that you have a light switch at the top of the stairs and the bottom of the stairs. If you do not have them, ask someone to add them for you. What else can I do to help prevent  falls? Wear shoes that: Do not have high heels. Have rubber bottoms. Are comfortable and fit you well. Are closed at the toe. Do not wear sandals. If you use a stepladder: Make sure that it is fully opened. Do not climb a closed stepladder. Make sure that both sides of the stepladder are locked into place. Ask someone to hold it for you, if possible. Clearly mark and make sure that you can see: Any grab bars or handrails. First and last steps. Where the edge of each step is. Use tools that help you move around (mobility aids) if they are needed. These include: Canes. Walkers. Scooters. Crutches. Turn on the lights when you go into a dark area. Replace any light bulbs as soon as they burn out. Set up your furniture so you have a clear path. Avoid moving your furniture around. If any of your floors are uneven, fix them. If there are any pets around you, be aware of where they are. Review your medicines with your doctor. Some medicines can make you feel dizzy. This can increase your chance of falling. Ask your doctor what other things that you can do to help prevent falls. This information is not intended to replace advice given to you by your health care provider. Make sure you discuss any questions you have with your health care provider. Document Released: 08/04/2009 Document Revised: 03/15/2016 Document Reviewed: 11/12/2014 Elsevier Interactive Patient Education  2017 [employer].

## 2022-12-21 IMAGING — MG MM DIGITAL SCREENING BILAT W/ TOMO AND CAD
8 series · 8 of 24 positions shown · non-contrast
Comparison: Previous exam(s).

CLINICAL DATA: Screening.

EXAM:
DIGITAL SCREENING BILATERAL MAMMOGRAM WITH TOMOSYNTHESIS AND CAD
TECHNIQUE: Bilateral screening digital craniocaudal and mediolateral oblique
mammograms were obtained. Bilateral screening digital breast
tomosynthesis was performed. The images were evaluated with
computer-aided detection.

[L CC synth-2D]
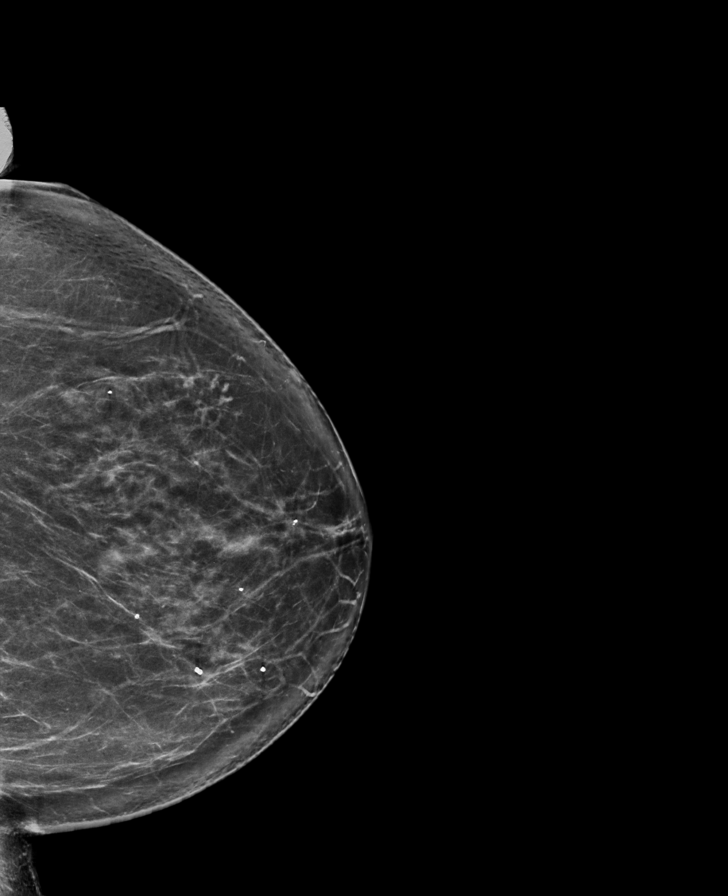

[R CC synth-2D]
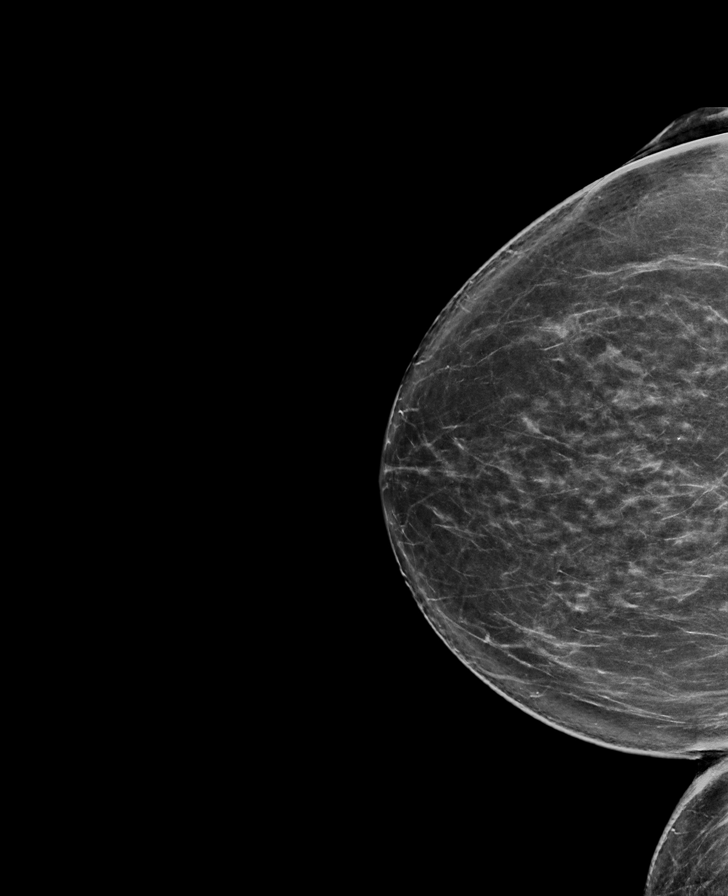

[R MLO synth-2D]
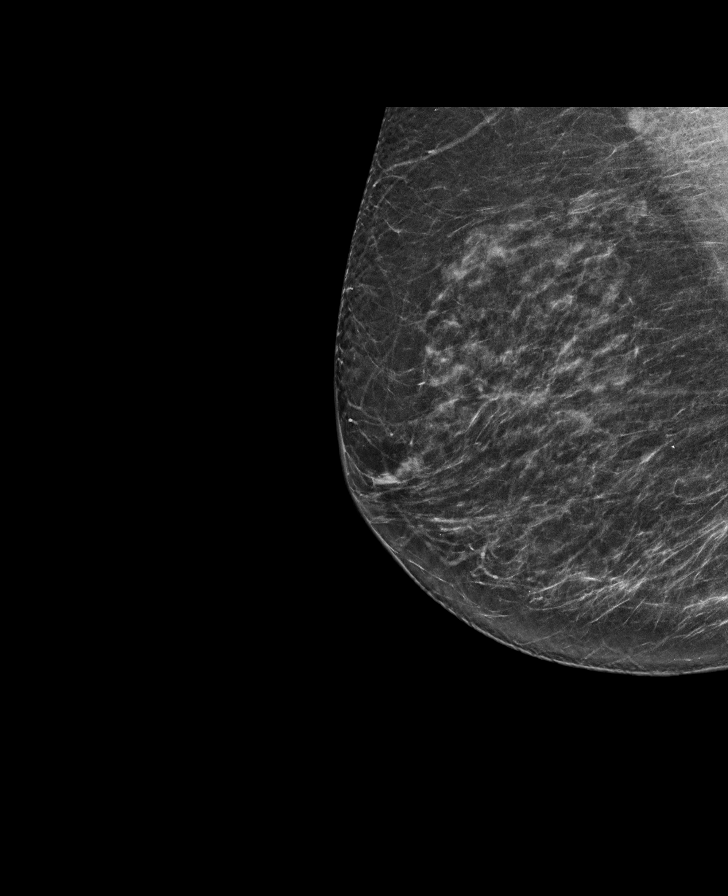

[L MLO synth-2D]
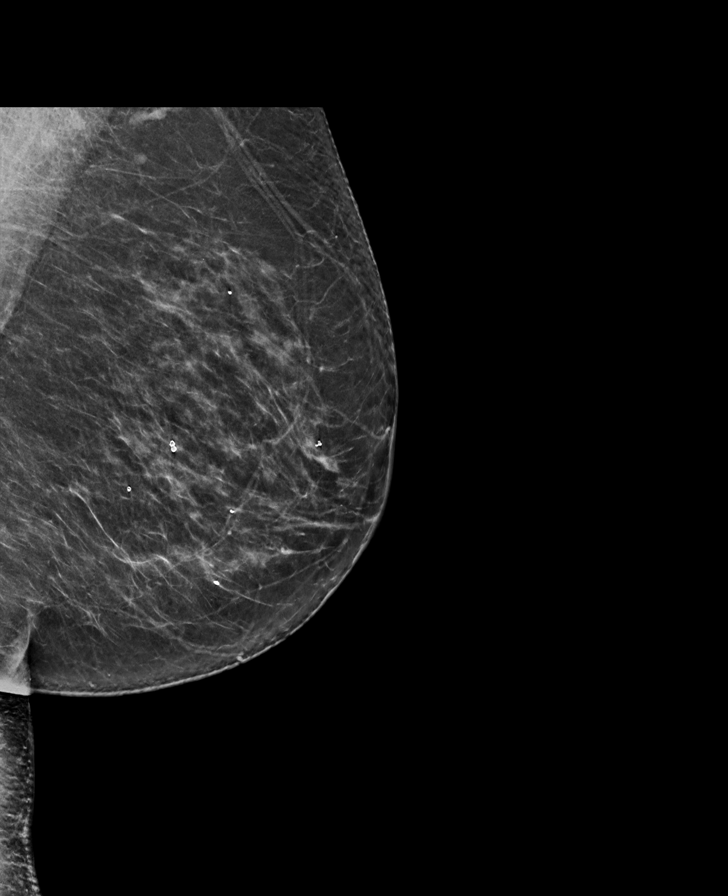

[L CC tomo · tomo slice 43/85.0]
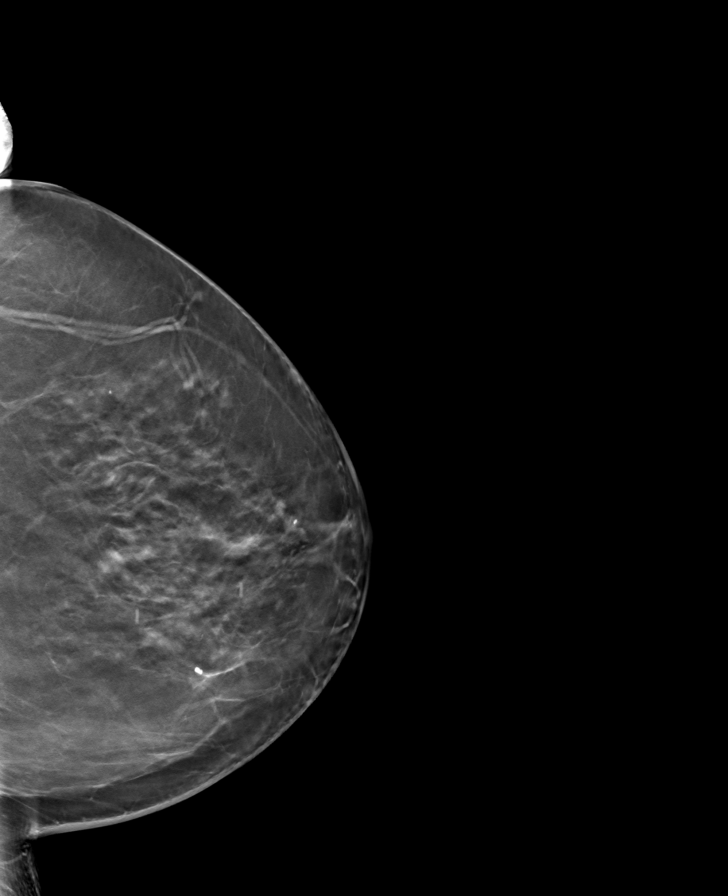

[R CC tomo · tomo slice 43/85.0]
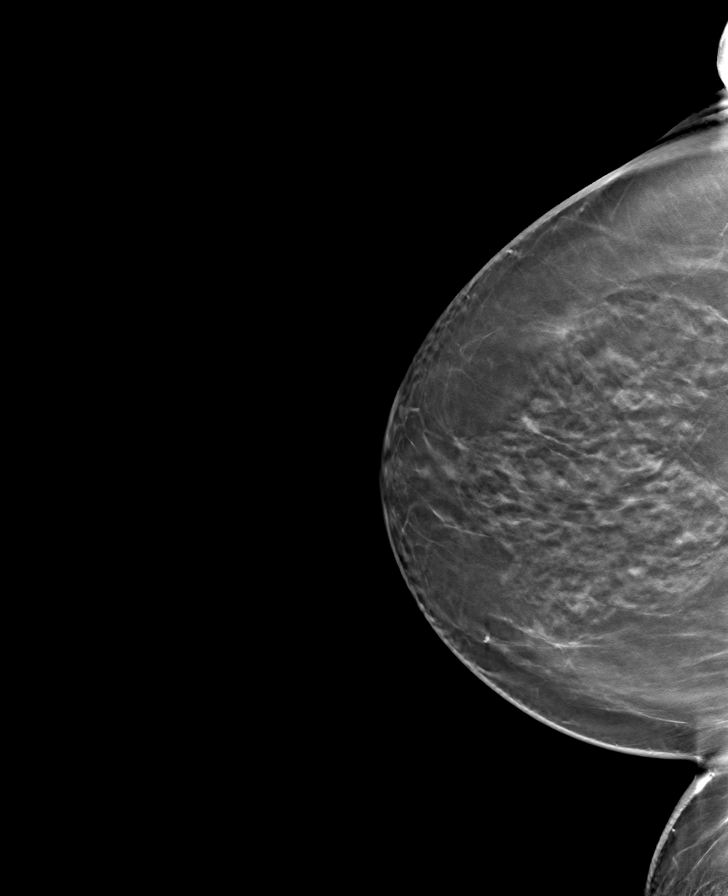

[R MLO tomo · tomo slice 37/72.0]
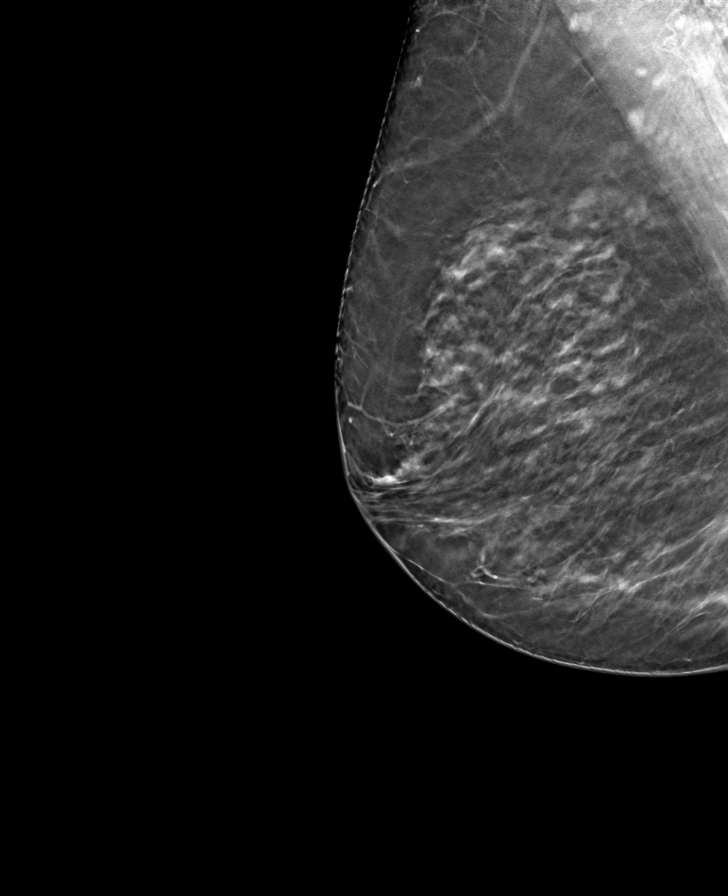

[L MLO tomo · tomo slice 39/77.0]
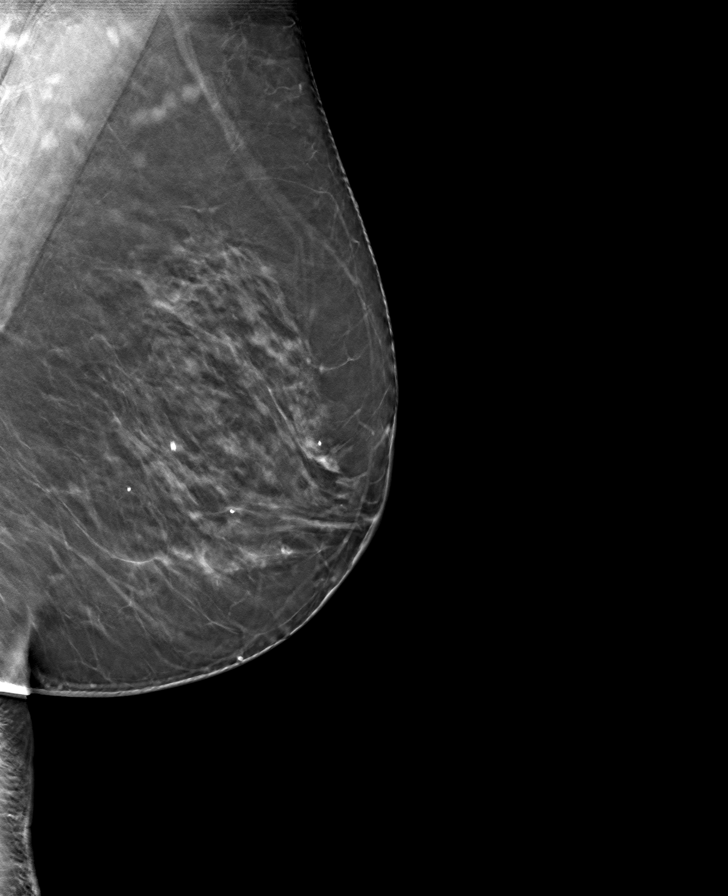

[8 of 24 positions shown; findings below may reference images not displayed]

ACR Breast Density Category b: There are scattered areas of
fibroglandular density.
FINDINGS: There are no findings suspicious for malignancy.
IMPRESSION: No mammographic evidence of malignancy. A result letter of this
screening mammogram will be mailed directly to the patient.

RECOMMENDATION:
Screening mammogram in one year. (Code:51-O-LD2)

BI-RADS CATEGORY  1: Negative.

## 2023-01-09 ENCOUNTER — Ambulatory Visit
Admission: RE | Admit: 2023-01-09 | Discharge: 2023-01-09 | Disposition: A | Discharge status: Home / Self Care | Payer: Medicare Other | Source: Ambulatory Visit | Attending: Family Medicine | Admitting: Family Medicine

## 2023-01-09 DIAGNOSIS — Z1231 Encounter for screening mammogram for malignant neoplasm of breast: Secondary | ICD-10-CM

## 2023-06-27 ENCOUNTER — Encounter: Payer: Self-pay | Admitting: Family Medicine

## 2023-06-27 ENCOUNTER — Ambulatory Visit (INDEPENDENT_AMBULATORY_CARE_PROVIDER_SITE_OTHER): Payer: Medicare Other | Admitting: Family Medicine

## 2023-06-27 VITALS — BP 102/58 | HR 72 | Temp 98.2°F | Resp 18 | Ht 63.5 in | Wt 160.0 lb

## 2023-06-27 DIAGNOSIS — E559 Vitamin D deficiency, unspecified: Secondary | ICD-10-CM | POA: Diagnosis not present

## 2023-06-27 DIAGNOSIS — Z23 Encounter for immunization: Secondary | ICD-10-CM | POA: Diagnosis not present

## 2023-06-27 DIAGNOSIS — E663 Overweight: Secondary | ICD-10-CM | POA: Diagnosis not present

## 2023-06-27 DIAGNOSIS — E041 Nontoxic single thyroid nodule: Secondary | ICD-10-CM

## 2023-06-27 DIAGNOSIS — R2230 Localized swelling, mass and lump, unspecified upper limb: Secondary | ICD-10-CM

## 2023-06-27 DIAGNOSIS — R239 Unspecified skin changes: Secondary | ICD-10-CM

## 2023-06-27 LAB — CBC WITH DIFFERENTIAL/PLATELET
Basophils Absolute: 0.1 10*3/uL (ref 0.0–0.1)
Basophils Relative: 0.9 % (ref 0.0–3.0)
Eosinophils Absolute: 0.1 10*3/uL (ref 0.0–0.7)
Eosinophils Relative: 1.2 % (ref 0.0–5.0)
HCT: 40.6 % (ref 36.0–46.0)
Hemoglobin: 13.5 g/dL (ref 12.0–15.0)
Lymphocytes Relative: 29.3 % (ref 12.0–46.0)
Lymphs Abs: 2.2 10*3/uL (ref 0.7–4.0)
MCHC: 33.3 g/dL (ref 30.0–36.0)
MCV: 92.1 fl (ref 78.0–100.0)
Monocytes Absolute: 0.6 10*3/uL (ref 0.1–1.0)
Monocytes Relative: 8.7 % (ref 3.0–12.0)
Neutro Abs: 4.4 10*3/uL (ref 1.4–7.7)
Neutrophils Relative %: 59.9 % (ref 43.0–77.0)
Platelets: 323 10*3/uL (ref 150.0–400.0)
RBC: 4.41 Mil/uL (ref 3.87–5.11)
RDW: 13.3 % (ref 11.5–15.5)
WBC: 7.4 10*3/uL (ref 4.0–10.5)

## 2023-06-27 NOTE — Assessment & Plan Note (Signed)
Check labs and replete prn. 

## 2023-06-27 NOTE — Assessment & Plan Note (Signed)
This is year 4 of imaging.  Recommendation was 5 yrs of imaging to ensure stability.  Order placed.

## 2023-06-27 NOTE — Assessment & Plan Note (Signed)
Ongoing issue for pt.  Encouraged low carb diet and regular exercise.  Will check labs to risk stratify.

## 2023-06-27 NOTE — Progress Notes (Signed)
   Subjective:    Patient ID: Joy Mosley, female    DOB: July 01, 1951, 72 y.o.   MRN: [id number]  HPI Thyroid nodule- due for repeat US based on mid L thyroid nodule.  Some intermittent fatigue.  Overweight- ongoing issue.  BMI 27.9.  no CP, SOB.  No abd pain, N/V.  Vit D deficiency- ongoing issue for pt.  Due for repeat labs.  Palmar mass- R palm, present for ~20 yrs.  Tender at times.  Does not interfere w/ ability to use hand   Review of Systems For ROS see HPI     Objective:   Physical Exam Vitals reviewed.  Constitutional:      General: She is not in acute distress.    Appearance: Normal appearance. She is well-developed. She is not ill-appearing.  HENT:     Head: Normocephalic and atraumatic.  Eyes:     Conjunctiva/sclera: Conjunctivae normal.     Pupils: Pupils are equal, round, and reactive to light.  Neck:     Thyroid: No thyromegaly.  Cardiovascular:     Rate and Rhythm: Normal rate and regular rhythm.     Pulses: Normal pulses.     Heart sounds: Normal heart sounds. No murmur heard. Pulmonary:     Effort: Pulmonary effort is normal. No respiratory distress.     Breath sounds: Normal breath sounds.  Abdominal:     General: There is no distension.     Palpations: Abdomen is soft.     Tenderness: There is no abdominal tenderness.  Musculoskeletal:     Cervical back: Normal range of motion and neck supple.     Right lower leg: No edema.     Left lower leg: No edema.  Lymphadenopathy:     Cervical: No cervical adenopathy.  Skin:    General: Skin is warm and dry.  Neurological:     General: No focal deficit present.     Mental Status: She is alert and oriented to person, place, and time.  Psychiatric:        Mood and Affect: Mood normal.        Behavior: Behavior normal.        Thought Content: Thought content normal.           Assessment & Plan:

## 2023-06-27 NOTE — Patient Instructions (Signed)
Follow up in 1 year or as needed We'll notify you of your lab results and make any changes if needed Get your Tdap at the pharmacy Keep up the good work on healthy diet and regular exercise- you look great! We'll call you to schedule your thyroid ultrasound Call with any questions or concerns Stay Safe!  Stay Healthy! Happy Fall!!

## 2023-06-28 ENCOUNTER — Telehealth: Payer: Self-pay

## 2023-06-28 LAB — BASIC METABOLIC PANEL
BUN: 19 mg/dL (ref 6–23)
CO2: 27 meq/L (ref 19–32)
Calcium: 9.3 mg/dL (ref 8.4–10.5)
Chloride: 105 meq/L (ref 96–112)
Creatinine, Ser: 0.74 mg/dL (ref 0.40–1.20)
GFR: 80.03 mL/min (ref >60.00)
Glucose, Bld: 83 mg/dL (ref 70–99)
Potassium: 4.1 mEq/L (ref 3.5–5.1)
Sodium: 138 meq/L (ref 135–145)

## 2023-06-28 LAB — HEPATIC FUNCTION PANEL
ALT: 13 U/L (ref 0–35)
AST: 17 U/L (ref 0–37)
Albumin: 4 g/dL (ref 3.5–5.2)
Alkaline Phosphatase: 52 U/L (ref 39–117)
Bilirubin, Direct: 0.1 mg/dL (ref 0.0–0.3)
Total Bilirubin: 0.5 mg/dL (ref 0.2–1.2)
Total Protein: 6.7 g/dL (ref 6.0–8.3)

## 2023-06-28 LAB — LIPID PANEL
Cholesterol: 183 mg/dL (ref 0–200)
HDL: 47 mg/dL (ref >39.00)
LDL Cholesterol: 104 mg/dL — ABNORMAL HIGH (ref 0–99)
NonHDL: 135.59
Total CHOL/HDL Ratio: 4
Triglycerides: 157 mg/dL — ABNORMAL HIGH (ref 0.0–149.0)
VLDL: 31.4 mg/dL (ref 0.0–40.0)

## 2023-06-28 LAB — VITAMIN D 25 HYDROXY (VIT D DEFICIENCY, FRACTURES): VITD: 32.16 ng/mL (ref 30.00–100.00)

## 2023-06-28 LAB — TSH: TSH: 1.32 u[IU]/mL (ref 0.35–5.50)

## 2023-06-28 NOTE — Telephone Encounter (Signed)
-----   Message from Neena Rhymes sent at 06/28/2023  3:08 PM EDT ----- Labs look great!  No changes at this time

## 2023-06-28 NOTE — Telephone Encounter (Signed)
Sent pt a my chart message with results her VM was full unable to leave a message .

## 2023-07-01 ENCOUNTER — Ambulatory Visit (HOSPITAL_BASED_OUTPATIENT_CLINIC_OR_DEPARTMENT_OTHER)
Admission: RE | Admit: 2023-07-01 | Discharge: 2023-07-01 | Disposition: A | Discharge status: Home / Self Care | Payer: Medicare Other | Source: Ambulatory Visit | Attending: Family Medicine | Admitting: Family Medicine

## 2023-07-01 DIAGNOSIS — E041 Nontoxic single thyroid nodule: Secondary | ICD-10-CM | POA: Insufficient documentation

## 2023-07-01 DIAGNOSIS — E042 Nontoxic multinodular goiter: Secondary | ICD-10-CM | POA: Diagnosis not present

## 2023-07-03 ENCOUNTER — Telehealth: Payer: Self-pay

## 2023-07-03 NOTE — Telephone Encounter (Signed)
-----   Message from Neena Rhymes sent at 07/03/2023  3:31 PM EDT ----- Nodules are unchanged and 1 more year of ultrasound is recommended.  This is great news!

## 2023-07-03 NOTE — Telephone Encounter (Signed)
I sent your result thru My chart . VM is full and no answer on home phone

## 2023-08-13 DIAGNOSIS — H524 Presbyopia: Secondary | ICD-10-CM | POA: Diagnosis not present

## 2023-08-13 DIAGNOSIS — H40013 Open angle with borderline findings, low risk, bilateral: Secondary | ICD-10-CM | POA: Diagnosis not present

## 2023-12-31 ENCOUNTER — Other Ambulatory Visit: Payer: Self-pay | Admitting: Family Medicine

## 2023-12-31 ENCOUNTER — Ambulatory Visit (INDEPENDENT_AMBULATORY_CARE_PROVIDER_SITE_OTHER): Payer: Medicare Other | Admitting: *Deleted

## 2023-12-31 DIAGNOSIS — Z0000 Encounter for general adult medical examination without abnormal findings: Secondary | ICD-10-CM

## 2023-12-31 DIAGNOSIS — Z1231 Encounter for screening mammogram for malignant neoplasm of breast: Secondary | ICD-10-CM

## 2023-12-31 NOTE — Progress Notes (Signed)
 Subjective:   Joy Mosley is a 73 y.o. female who presents for Medicare Annual (Subsequent) preventive examination.  Visit Complete: Virtual I connected with  Joy Mosley on 12/31/23 by a audio enabled telemedicine application and verified that I am speaking with the correct person using two identifiers.  Patient Location: Home  Provider Location: Home Office  I discussed the limitations of evaluation and management by telemedicine. The patient expressed understanding and agreed to proceed.  Vital Signs: Because this visit was a virtual/telehealth visit, some criteria may be missing or patient reported. Any vitals not documented were not able to be obtained and vitals that have been documented are patient reported.   Cardiac Risk Factors include: advanced age (>33men, >42 women)     Objective:    There were no vitals filed for this visit. There is no height or weight on file to calculate BMI.     12/13/2022    9:24 AM 10/12/2021    9:52 AM 10/03/2020    3:48 PM 07/06/2019   11:06 AM 11/19/2018   11:14 PM 04/06/2016    9:38 AM 09/07/2014    9:21 AM  Advanced Directives  Does Patient Have a Medical Advance Directive? Yes Yes Yes Yes Yes Yes Yes  Type of Estate agent of [place];Living will Healthcare Power of [place];Living will Healthcare Power of [place];Living will Healthcare Power of [place];Living will Healthcare Power of [place];Living will Healthcare Power of [place];Living will Healthcare Power of [place];Living will  Does patient want to make changes to medical advance directive?    Yes (MAU/Ambulatory/Procedural Areas - Information given) No - Patient declined No - Patient declined   Copy of Healthcare Power of Attorney in Chart? No - copy requested No - copy requested No - copy requested No - copy requested No - copy requested No - copy requested     Current Medications (verified) Outpatient Encounter Medications as of 12/31/2023   Medication Sig   Vitamin D, Ergocalciferol, (DRISDOL) 1.25 MG (50000 UNIT) CAPS capsule Take 1 capsule (50,000 Units total) by mouth every 7 (seven) days.   Vitamin D, Ergocalciferol, (DRISDOL) 1.25 MG (50000 UNIT) CAPS capsule Take 50,000 Units by mouth every 7 (seven) days.   No facility-administered encounter medications on file as of 12/31/2023.    Allergies (verified) Patient has no known allergies.   History: Past Medical History:  Diagnosis Date   Diverticulitis    Past Surgical History:  Procedure Laterality Date   BREAST CYST ASPIRATION     CHOLECYSTECTOMY N/A 11/25/2018   Procedure: LAPAROSCOPIC CHOLECYSTECTOMY WITH  INTRAOPERATIVE CHOLANGIOGRAM;  Surgeon: Kinsinger, De Blanch, MD;  Location: [facility] OR;  Service: General;  Laterality: N/A;   OVARIAN CYST REMOVAL  1980   TONSILLECTOMY AND ADENOIDECTOMY     Family History  Problem Relation Age of Onset   Leukemia Mother    Cancer Father        prostate   Cancer Maternal Grandfather    Breast cancer Sister    Social History   Socioeconomic History   Marital status: Widowed    Spouse name: Not on file   Number of children: Not on file   Years of education: Not on file   Highest education level: Not on file  Occupational History   Not on file  Tobacco Use   Smoking status: Never   Smokeless tobacco: Never  Substance and Sexual Activity   Alcohol use: Yes    Comment: rarely   Drug use: No  Sexual activity: Not Currently  Other Topics Concern   Not on file  Social History Narrative   Not on file   Social Drivers of Health   Financial Resource Strain: Low Risk  (12/31/2023)   Overall Financial Resource Strain (CARDIA)    Difficulty of Paying Living Expenses: Not hard at all  Food Insecurity: No Food Insecurity (12/31/2023)   Hunger Vital Sign    Worried About Running Out of Food in the Last Year: Never true    Ran Out of Food in the Last Year: Never true  Transportation Needs: No Transportation Needs  (12/31/2023)   PRAPARE - Administrator, Civil Service (Medical): No    Lack of Transportation (Non-Medical): No  Physical Activity: Sufficiently Active (12/31/2023)   Exercise Vital Sign    Days of Exercise per Week: 4 days    Minutes of Exercise per Session: 40 min  Stress: No Stress Concern Present (12/31/2023)   [employer] of Occupational Health - Occupational Stress Questionnaire    Feeling of Stress : Not at all  Social Connections: Moderately Isolated (12/31/2023)   Social Connection and Isolation Panel [NHANES]    Frequency of Communication with Friends and Family: More than three times a week    Frequency of Social Gatherings with Friends and Family: Three times a week    Attends Religious Services: Never    Active Member of Clubs or Organizations: Yes    Attends Banker Meetings: More than 4 times per year    Marital Status: Widowed    Tobacco Counseling Counseling given: Not Answered   Clinical Intake:  Pre-visit preparation completed: Yes  Pain : No/denies pain     Diabetes: No  How often do you need to have someone help you when you read instructions, pamphlets, or other written materials from your doctor or pharmacy?: 1 - Never  Interpreter Needed?: No  Information entered by :: Remi Haggard LPN   Activities of Daily Living    12/31/2023    3:43 PM 06/27/2023    1:40 PM  In your present state of health, do you have any difficulty performing the following activities:  Hearing? 1 0  Vision? 0 0  Difficulty concentrating or making decisions? 0 0  Walking or climbing stairs? 0 0  Dressing or bathing? 0 0  Doing errands, shopping? 0 0  Preparing Food and eating ? N   Using the Toilet? N   In the past six months, have you accidently leaked urine? Y   Do you have problems with loss of bowel control? N   Managing your Medications? N   Managing your Finances? N   Housekeeping or managing your Housekeeping? N     Patient Care  Team: Sheliah Hatch, MD as PCP - General  Indicate any recent Medical Services you may have received from other than [facility] providers in the past year (date may be approximate).     Assessment:   This is a routine wellness examination for [name].  Hearing/Vision screen Hearing Screening - Comments:: No trouble hearing Vision Screening - Comments:: Up to date [place]    Goals Addressed             This Visit's Progress    Patient Stated       Eat healthlier       Depression Screen    12/31/2023    3:45 PM 06/27/2023    1:39 PM 12/13/2022    9:22 AM 07/02/2022  1:41 PM 10/12/2021    9:54 AM 10/12/2021    9:50 AM 07/19/2021   10:05 AM  PHQ 2/9 Scores  PHQ - 2 Score 0 0 0 0 0 0 0  PHQ- 9 Score 0 1  1   1     Fall Risk    06/27/2023    1:39 PM 12/13/2022    9:23 AM 12/12/2022    4:12 PM 07/02/2022    1:41 PM 10/12/2021    9:52 AM  Fall Risk   Falls in the past year? 0 0 0 0 0  Number falls in past yr: 0 0 0 0 0  Injury with Fall? 0 0 0 0 0  Risk for fall due to : No Fall Risks No Fall Risks  No Fall Risks   Follow up Falls evaluation completed Falls prevention discussed  Falls evaluation completed Falls evaluation completed    MEDICARE RISK AT HOME:    TIMED UP AND GO:  Was the test performed?  No    Cognitive Function:        12/31/2023    3:44 PM 12/13/2022    9:24 AM  6CIT Screen  What Year? 0 points 0 points  What month? 0 points 0 points  What time? 0 points 0 points  Count back from 20 0 points 0 points  Months in reverse 0 points 0 points  Repeat phrase 0 points 0 points  Total Score 0 points 0 points    Immunizations Immunization History  Administered Date(s) Administered   Fluad Quad(high Dose 65+) 07/02/2022   Fluad Trivalent(High Dose 65+) 06/27/2023   PFIZER(Purple Top)SARS-COV-2 Vaccination 11/11/2019, 12/02/2019, 07/29/2020   Pneumococcal Conjugate-13 07/06/2019   Pneumococcal Polysaccharide-23 07/13/2020    TDAP status: Due,  Education has been provided regarding the importance of this vaccine. Advised may receive this vaccine at local pharmacy or Health Dept. Aware to provide a copy of the vaccination record if obtained from local pharmacy or Health Dept. Verbalized acceptance and understanding.  Flu Vaccine status: Up to date  Pneumococcal vaccine status: Up to date  Covid-19 vaccine status: Information provided on how to obtain vaccines.   Qualifies for Shingles Vaccine? No   Zostavax completed Yes   Shingrix Completed?: Yes  Screening Tests Health Maintenance  Topic Date Due   MAMMOGRAM  01/09/2024   Medicare Annual Wellness (AWV)  12/30/2024   Colonoscopy  08/02/2026   Pneumonia Vaccine 41+ Years old  Completed   INFLUENZA VACCINE  Completed   DEXA SCAN  Completed   Hepatitis C Screening  Completed   HPV VACCINES  Aged Out   DTaP/Tdap/Td  Discontinued   COVID-19 Vaccine  Discontinued   Zoster Vaccines- Shingrix  Discontinued    Health Maintenance  There are no preventive care reminders to display for this patient.   Colorectal cancer screening: No longer required.   Mammogram   will scheduled  Bone Density status: Ordered  . Pt provided with contact info and advised to call to schedule appt.  Lung Cancer Screening: (Low Dose CT Chest recommended if Age 75-80 years, 20 pack-year currently smoking OR have quit w/in 15years.) does not qualify.   Lung Cancer Screening Referral:   Additional Screening:  Hepatitis C Screening: does not qualify; Completed 2020  Vision Screening: Recommended annual ophthalmology exams for early detection of glaucoma and other disorders of the eye. Is the patient up to date with their annual eye exam?  Yes  Who is the provider or what is  the name of the office in which the patient attends annual eye exams? Unsure of name If pt is not established with a provider, would they like to be referred to a provider to establish care? No .   Dental Screening:  Recommended annual dental exams for proper oral hygiene    Community Resource Referral / Chronic Care Management: CRR required this visit?  No   CCM required this visit?  No     Plan:     I have personally reviewed and noted the following in the patient's chart:   Medical and social history Use of alcohol, tobacco or illicit drugs  Current medications and supplements including opioid prescriptions. Patient is not currently taking opioid prescriptions. Functional ability and status Nutritional status Physical activity Advanced directives List of other physicians Hospitalizations, surgeries, and ER visits in previous 12 months Vitals Screenings to include cognitive, depression, and falls Referrals and appointments  In addition, I have reviewed and discussed with patient certain preventive protocols, quality metrics, and best practice recommendations. A written personalized care plan for preventive services as well as general preventive health recommendations were provided to patient.     Remi Haggard, LPN   6/38/7564   After Visit Summary: (MyChart) Due to this being a telephonic visit, the after visit summary with patients personalized plan was offered to patient via MyChart   Nurse Notes:

## 2023-12-31 NOTE — Patient Instructions (Signed)
 Joy Mosley , Thank you for taking time to come for your Medicare Wellness Visit. I appreciate your ongoing commitment to your health goals. Please review the following plan we discussed and let me know if I can assist you in the future.   Screening recommendations/referrals: Colonoscopy: up to date Mammogram: Education provided Bone Density: Education provided Recommended yearly ophthalmology/optometry visit for glaucoma screening and checkup Recommended yearly dental visit for hygiene and checkup  Vaccinations: Influenza vaccine: up to date Pneumococcal vaccine: up to date Tdap vaccine: Education provided Shingles vaccine: up to date       Preventive Care 65 Years and Older, Female Preventive care refers to lifestyle choices and visits with your health care provider that can promote health and wellness. What does preventive care include? A yearly physical exam. This is also called an annual well check. Dental exams once or twice a year. Routine eye exams. Ask your health care provider how often you should have your eyes checked. Personal lifestyle choices, including: Daily care of your teeth and gums. Regular physical activity. Eating a healthy diet. Avoiding tobacco and drug use. Limiting alcohol use. Practicing safe sex. Taking low-dose aspirin every day. Taking vitamin and mineral supplements as recommended by your health care provider. What happens during an annual well check? The services and screenings done by your health care provider during your annual well check will depend on your age, overall health, lifestyle risk factors, and family history of disease. Counseling  Your health care provider may ask you questions about your: Alcohol use. Tobacco use. Drug use. Emotional well-being. Home and relationship well-being. Sexual activity. Eating habits. History of falls. Memory and ability to understand (cognition). Work and work Astronomer. Reproductive  health. Screening  You may have the following tests or measurements: Height, weight, and BMI. Blood pressure. Lipid and cholesterol levels. These may be checked every 5 years, or more frequently if you are over 22 years old. Skin check. Lung cancer screening. You may have this screening every year starting at age 41 if you have a 30-pack-year history of smoking and currently smoke or have quit within the past 15 years. Fecal occult blood test (FOBT) of the stool. You may have this test every year starting at age 64. Flexible sigmoidoscopy or colonoscopy. You may have a sigmoidoscopy every 5 years or a colonoscopy every 10 years starting at age 48. Hepatitis C blood test. Hepatitis B blood test. Sexually transmitted disease (STD) testing. Diabetes screening. This is done by checking your blood sugar (glucose) after you have not eaten for a while (fasting). You may have this done every 1-3 years. Bone density scan. This is done to screen for osteoporosis. You may have this done starting at age 23. Mammogram. This may be done every 1-2 years. Talk to your health care provider about how often you should have regular mammograms. Talk with your health care provider about your test results, treatment options, and if necessary, the need for more tests. Vaccines  Your health care provider may recommend certain vaccines, such as: Influenza vaccine. This is recommended every year. Tetanus, diphtheria, and acellular pertussis (Tdap, Td) vaccine. You may need a Td booster every 10 years. Zoster vaccine. You may need this after age 71. Pneumococcal 13-valent conjugate (PCV13) vaccine. One dose is recommended after age 92. Pneumococcal polysaccharide (PPSV23) vaccine. One dose is recommended after age 2. Talk to your health care provider about which screenings and vaccines you need and how often you need them. This information is  not intended to replace advice given to you by your health care provider.  Make sure you discuss any questions you have with your health care provider. Document Released: 11/04/2015 Document Revised: 06/27/2016 Document Reviewed: 08/09/2015 Elsevier Interactive Patient Education  2017 [employer].  Fall Prevention in the Home Falls can cause injuries. They can happen to people of all ages. There are many things you can do to make your home safe and to help prevent falls. What can I do on the outside of my home? Regularly fix the edges of walkways and driveways and fix any cracks. Remove anything that might make you trip as you walk through a door, such as a raised step or threshold. Trim any bushes or trees on the path to your home. Use bright outdoor lighting. Clear any walking paths of anything that might make someone trip, such as rocks or tools. Regularly check to see if handrails are loose or broken. Make sure that both sides of any steps have handrails. Any raised decks and porches should have guardrails on the edges. Have any leaves, snow, or ice cleared regularly. Use sand or salt on walking paths during winter. Clean up any spills in your garage right away. This includes oil or grease spills. What can I do in the bathroom? Use night lights. Install grab bars by the toilet and in the tub and shower. Do not use towel bars as grab bars. Use non-skid mats or decals in the tub or shower. If you need to sit down in the shower, use a plastic, non-slip stool. Keep the floor dry. Clean up any water that spills on the floor as soon as it happens. Remove soap buildup in the tub or shower regularly. Attach bath mats securely with double-sided non-slip rug tape. Do not have throw rugs and other things on the floor that can make you trip. What can I do in the bedroom? Use night lights. Make sure that you have a light by your bed that is easy to reach. Do not use any sheets or blankets that are too big for your bed. They should not hang down onto the floor. Have a  firm chair that has side arms. You can use this for support while you get dressed. Do not have throw rugs and other things on the floor that can make you trip. What can I do in the kitchen? Clean up any spills right away. Avoid walking on wet floors. Keep items that you use a lot in easy-to-reach places. If you need to reach something above you, use a strong step stool that has a grab bar. Keep electrical cords out of the way. Do not use floor polish or wax that makes floors slippery. If you must use wax, use non-skid floor wax. Do not have throw rugs and other things on the floor that can make you trip. What can I do with my stairs? Do not leave any items on the stairs. Make sure that there are handrails on both sides of the stairs and use them. Fix handrails that are broken or loose. Make sure that handrails are as long as the stairways. Check any carpeting to make sure that it is firmly attached to the stairs. Fix any carpet that is loose or worn. Avoid having throw rugs at the top or bottom of the stairs. If you do have throw rugs, attach them to the floor with carpet tape. Make sure that you have a light switch at the top of the  stairs and the bottom of the stairs. If you do not have them, ask someone to add them for you. What else can I do to help prevent falls? Wear shoes that: Do not have high heels. Have rubber bottoms. Are comfortable and fit you well. Are closed at the toe. Do not wear sandals. If you use a stepladder: Make sure that it is fully opened. Do not climb a closed stepladder. Make sure that both sides of the stepladder are locked into place. Ask someone to hold it for you, if possible. Clearly mark and make sure that you can see: Any grab bars or handrails. First and last steps. Where the edge of each step is. Use tools that help you move around (mobility aids) if they are needed. These include: Canes. Walkers. Scooters. Crutches. Turn on the lights when you  go into a dark area. Replace any light bulbs as soon as they burn out. Set up your furniture so you have a clear path. Avoid moving your furniture around. If any of your floors are uneven, fix them. If there are any pets around you, be aware of where they are. Review your medicines with your doctor. Some medicines can make you feel dizzy. This can increase your chance of falling. Ask your doctor what other things that you can do to help prevent falls. This information is not intended to replace advice given to you by your health care provider. Make sure you discuss any questions you have with your health care provider. Document Released: 08/04/2009 Document Revised: 03/15/2016 Document Reviewed: 11/12/2014 Elsevier Interactive Patient Education  2017 [employer].

## 2024-01-17 ENCOUNTER — Ambulatory Visit
Admission: RE | Admit: 2024-01-17 | Discharge: 2024-01-17 | Disposition: A | Discharge status: Home / Self Care | Source: Ambulatory Visit | Attending: Family Medicine | Admitting: Family Medicine

## 2024-01-17 DIAGNOSIS — Z1231 Encounter for screening mammogram for malignant neoplasm of breast: Secondary | ICD-10-CM

## 2024-05-19 ENCOUNTER — Ambulatory Visit: Admitting: Physician Assistant

## 2024-05-19 ENCOUNTER — Encounter: Payer: Self-pay | Admitting: Physician Assistant

## 2024-05-19 VITALS — BP 96/63

## 2024-05-19 DIAGNOSIS — D492 Neoplasm of unspecified behavior of bone, soft tissue, and skin: Secondary | ICD-10-CM

## 2024-05-19 DIAGNOSIS — D2371 Other benign neoplasm of skin of right lower limb, including hip: Secondary | ICD-10-CM

## 2024-05-19 DIAGNOSIS — L814 Other melanin hyperpigmentation: Secondary | ICD-10-CM | POA: Diagnosis not present

## 2024-05-19 DIAGNOSIS — L821 Other seborrheic keratosis: Secondary | ICD-10-CM

## 2024-05-19 DIAGNOSIS — D0339 Melanoma in situ of other parts of face: Secondary | ICD-10-CM | POA: Diagnosis not present

## 2024-05-19 DIAGNOSIS — D485 Neoplasm of uncertain behavior of skin: Secondary | ICD-10-CM

## 2024-05-19 DIAGNOSIS — D239 Other benign neoplasm of skin, unspecified: Secondary | ICD-10-CM

## 2024-05-19 NOTE — Progress Notes (Signed)
   New Patient Visit   Subjective  Joy Mosley is a 73 y.o. NEW female who presents for the following: Brown spot of chin x several years. It gets darker in the summer. There is also a spot on her left cheek, right neck, behind her right knee, right wrist and left forearm that she would like checked. No history of skin cancer.     The following portions of the chart were reviewed this encounter and updated as appropriate: medications, allergies, medical history  Review of Systems:  No other skin or systemic complaints except as noted in HPI or Assessment and Plan.  Objective  Well appearing patient in no apparent distress; mood and affect are within normal limits.   A focused examination was performed of the following areas: face, neck, arms, hands and right lower leg.    Relevant exam findings are noted in the Assessment and Plan.  Mid Chin 0.7 cm irregular brown macule   Assessment & Plan   DERMATOFIBROMA Exam: Firm pink/brown papulenodule with dimple sign of right lat knee. Treatment Plan: A dermatofibroma is a benign growth possibly related to trauma, such as an insect bite, cut from shaving, or inflamed acne-type bump.  Treatment options to remove include shave or excision with resulting scar and risk of recurrence.  Since benign-appearing and not bothersome, will observe for now.   SEBORRHEIC KERATOSIS - Stuck-on, waxy, tan-brown papules and/or plaques  - Benign-appearing - Discussed benign etiology and prognosis. - Observe - Call for any changes   LENTIGINES Exam: scattered tan macules Due to sun exposure Treatment Plan: Benign-appearing, observe. Recommend daily broad spectrum sunscreen SPF 30+ to sun-exposed areas, reapply every 2 hours as needed.  Call for any changes   NEOPLASM OF UNCERTAIN BEHAVIOR OF SKIN Mid Chin Epidermal / dermal shaving  Lesion diameter (cm):  0.7 Informed consent: discussed and consent obtained   Timeout: patient name, date  of birth, surgical site, and procedure verified   Procedure prep:  Patient was prepped and draped in usual sterile fashion Prep type:  Isopropyl alcohol Anesthesia: the lesion was anesthetized in a standard fashion   Anesthetic:  1% lidocaine  w/ epinephrine 1-100,000 buffered w/ 8.4% NaHCO3 Instrument used: flexible razor blade   Hemostasis achieved with: pressure, aluminum chloride and electrodesiccation   Outcome: patient tolerated procedure well   Post-procedure details: sterile dressing applied and wound care instructions given   Dressing type: bandage and petrolatum    Specimen 1 - Surgical pathology Differential Diagnosis: Pigmented AK vs Pigmented SCC vs [place] Maligna  Check Margins: No DERMATOFIBROMA   SEBORRHEIC KERATOSIS   LENTIGINES    Return for Pending biopsy results.  I, Roseline Hutchinson, CMA, am acting as scribe for Welby Montminy K, PA-C .   Documentation: I have reviewed the above documentation for accuracy and completeness, and I agree with the above.  Chloie Loney K, PA-C

## 2024-05-19 NOTE — Patient Instructions (Signed)

## 2024-05-22 LAB — SURGICAL PATHOLOGY

## 2024-05-25 ENCOUNTER — Ambulatory Visit: Payer: Self-pay | Admitting: Physician Assistant

## 2024-06-15 ENCOUNTER — Encounter: Payer: Self-pay | Admitting: Dermatology

## 2024-06-18 ENCOUNTER — Encounter: Payer: Self-pay | Admitting: Dermatology

## 2024-06-18 ENCOUNTER — Ambulatory Visit (INDEPENDENT_AMBULATORY_CARE_PROVIDER_SITE_OTHER): Admitting: Dermatology

## 2024-06-18 VITALS — BP 125/73 | HR 76 | Temp 97.9°F

## 2024-06-18 DIAGNOSIS — D0339 Melanoma in situ of other parts of face: Secondary | ICD-10-CM

## 2024-06-18 DIAGNOSIS — L814 Other melanin hyperpigmentation: Secondary | ICD-10-CM

## 2024-06-18 DIAGNOSIS — L579 Skin changes due to chronic exposure to nonionizing radiation, unspecified: Secondary | ICD-10-CM

## 2024-06-18 DIAGNOSIS — C439 Malignant melanoma of skin, unspecified: Secondary | ICD-10-CM

## 2024-06-18 MED ORDER — MUPIROCIN 2 % EX OINT: 1.0000 | TOPICAL_OINTMENT | Freq: Two times a day (BID) | CUTANEOUS | 0 refills | Status: DC

## 2024-06-18 MED ORDER — TRAMADOL HCL 50 MG PO TABS: 50.0000 mg | ORAL_TABLET | Freq: Four times a day (QID) | ORAL | 0 refills | Status: DC | PRN

## 2024-06-18 NOTE — Addendum Note (Signed)
 Addended by: COREY RUFUS HERO on: 06/18/2024 06:20 PM   Modules accepted: Orders

## 2024-06-18 NOTE — Patient Instructions (Signed)

## 2024-06-18 NOTE — Progress Notes (Signed)
 Follow-Up Visit   Subjective  Joy Mosley is a 73 y.o. 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   The following portions of the chart were reviewed this encounter and updated as appropriate: medications, allergies, medical history  Review of Systems:  No other skin or systemic complaints except as noted in HPI or Assessment and Plan.  Objective  Well appearing patient in no apparent distress; mood and affect are within normal limits.  A focused examination was performed of the following areas: Mid chin Relevant physical exam findings are noted in the Assessment and Plan.   Mid Chin Healing biopsy site   Assessment & Plan   MALIGNANT MELANOMA OF SKIN (HCC) Mid Chin Mohs surgery  Consent obtained: written  Anticoagulation: Was the anticoagulation regimen changed prior to Mohs? No    Anesthesia: Anesthesia method: local infiltration Local anesthetic: lidocaine  1% WITH epi  Procedure Details: Timeout: pre-procedure verification complete Procedure Prep: patient was prepped and draped in usual sterile fashion Prep type: chlorhexidine Biopsy accession number: [id number] Pre-Op diagnosis: melanoma Melanoma subtype: in situ MohsAIQ Surgical site (if tumor spans multiple areas, please select predominant area): chin Surgery side: midline Surgical site (from skin exam): Mid Chin Pre-operative length (cm): 0.8 Pre-operative width (cm): 0.9 Indications for Mohs surgery: anatomic location where tissue conservation is critical, ill-defined borders and aggressive histology  Micrographic Surgery Details: Post-operative length (cm): 2.3 Post-operative width (cm): 2 Number of Mohs stages: 2 Cumulative additional sections past 5 per stage: 0 Post surgery depth of defect: dermis, subcutaneous fat and skeletal muscle  Stage 1    Tumor features identified on Mohs section: melanoma  Stage 2    Tumor  features identified on Mohs section: no tumor identified  Reconstruction: Was the defect reconstructed? Yes   Was reconstruction performed by the same Mohs surgeon? Yes   Setting of reconstruction: outpatient office When was reconstruction performed? same day Type of reconstruction: flap Type of flap: advancement   Advancement flap type: bilateral double arm  Related Medications traMADol  (ULTRAM ) 50 MG tablet Take 1 tablet (50 mg total) by mouth every 6 (six) hours as needed for up to 10 days. traMADol  (ULTRAM ) 50 MG tablet Take 1 tablet (50 mg total) by mouth every 6 (six) hours as needed for up to 10 doses. mupirocin  ointment (BACTROBAN ) 2 % Apply 1 Application topically 2 (two) times daily.   Return in about 1 week (around 06/25/2024) for wound check/ suture removal.  I, Darice Smock, CMA, am acting as scribe for RUFUS CHRISTELLA HOLY, MD.    06/18/2024  HISTORY OF PRESENT ILLNESS  Joy Mosley is seen in consultation at the request of Erminio Like, PA-C for biopsy-proven Melanoma in Situ of the chin. They note that the area has been present for about 6 months-1 year increasing in size with time.  There is no history of previous treatment.  Reports no other new or changing lesions and has no other complaints today.  Medications and allergies: see patient chart.  Review of systems: Reviewed 8 systems and notable for the above skin cancer.  All other systems reviewed are unremarkable/negative, unless noted in the HPI. Past medical history, surgical history, family history, social history were also reviewed and are noted in the chart/questionnaire.    PHYSICAL EXAMINATION  General: Well-appearing, in no acute distress, alert and oriented x 4. Vitals reviewed in chart (if available).   Skin: Exam reveals a 0.8  x 0.9 cm erythematous papule and biopsy scar on the chin. There are rhytids, telangiectasias, and lentigines, consistent with photodamage.  Biopsy report(s) reviewed,  confirming the diagnosis.   ASSESSMENT  1) Melanoma in Situ of the chin 2) photodamage 3) solar lentigines   PLAN   1. Due to location, size, histology, or recurrence and the likelihood of subclinical extension as well as the need to conserve normal surrounding tissue, the patient was deemed acceptable for Mohs micrographic surgery (MMS).  The nature and purpose of the procedure, associated benefits and risks including recurrence and scarring, possible complications such as pain, infection, and bleeding, and alternative methods of treatment if appropriate were discussed with the patient during consent. The lesion location was verified by the patient, by reviewing previous notes, pathology reports, and by photographs as well as angulation measurements if available.  Informed consent was reviewed and signed by the patient, and timeout was performed at 8:15 AM. See op note below.  2. For the photodamage and solar lentigines, sun protection discussed/information given on OTC sunscreens, and we recommend continued regular follow-up with primary dermatologist every 6 months or sooner for any growing, bleeding, or changing lesions. 3. Prognosis and future surveillance discussed. 4. Letter with treatment outcome sent to referring provider. 5. Pain acetaminophen /ibuprofen/tramadol  50 mg  MOHS MICROGRAPHIC SURGERY AND RECONSTRUCTION  Initial size:   0.8 x 0.9 cm Surgical defect/wound size: 2.3 x 2.0 cm Anesthesia:    0.33% lidocaine  with 1:200,000 epinephrine EBL:    <5 mL Complications:  None Repair type:   Adjacent Tissue Transfer (Bilateral Advancement Flap) SQ suture:   5-0 Monocryl, 4-0 Vicryl Cutaneous suture:  5-0 Polyprolene Final size of the repair: 7.0 x  2.3 = 16.1 cm^2  Stages: 2  STAGE I: Anesthesia achieved with 0.5% lidocaine  with 1:200,000 epinephrine. ChloraPrep applied. 5 section(s) excised using Mohs technique (this includes total peripheral and deep tissue margin excision and  evaluation with frozen sections, excised and interpreted by the same physician). The tumor was first debulked and then excised with an approx. 2 mm margin.  Hemostasis was achieved with electrocautery as needed.  The specimen was then oriented, subdivided/relaxed, inked, and processed using Mohs technique.  Tissue was stained with H&E and MART-1 with 2 chromogens (2 immunostains).  Frozen section analysis revealed a positive margin for atypical proliferation of melanocytes arranged in a confluent and pagetoid pattern along the basal and suprabasal layers. The melanocytes exhibit nuclear pleomorphism, hyperchromasia, and prominent nucleoli. There is an increased number of melanocytes at the dermoepidermal junction with focal nesting and single-cell spread. No evidence of dermal invasion is seen in the peripheral margin.    STAGE II: An additional 2 mm margin was excised.  Hemostasis was achieved with electrocautery as needed.  The specimen was then oriented, subdivided/relaxed, inked, and processed using Mohs technique. Evaluation of slides by the Mohs surgeon revealed clear tumor margins. Tissue was stained with H&E and MART-1 with 2 chromogens (2 immunostains).   Reconstruction  PROCEDURE: Advancement Flap The nature of the procedure was discussed with the patient in detail, including alternatives.  The risks discussed included but not limited to potential for infection, bleeding, scar formation, and damage to underlying structures.  The patient understood the risks and signed the consent form (scanned into chart).  This wound was reconstructed with an advancement flap.  Local anesthesia was achieved with the anesthetic indicated above.  The operative site was prepped with a surgical antiseptic solution, and then draped with sterile towels to  insure a sterile field.  The beveled edges of the wound were excised to 90 degrees relative to the surface skin plane.  The wound was undermined in all directions,  and meticulous hemostasis was achieved with an electrosurgical device.  Relaxing incisions were made, if necessary, for lateral tension release.  The tissue was advanced and closed centrally, and redundant tissue was trimmed as necessary.  The wound was sutured in a layered fashion to close potential dead space and to precisely and securely approximate the wound edges.  The dimensions of the flap were:  7.0  cm x 2.3 cm for a total flap surface area of 16.1 centimeters squared (cm2).    The wound was covered with petrolatum and a dressing.  The patient understands the need to return immediately for any signs of infection to include swelling, pain, purulent discharge, localized warmth, or fever.  Contact information was provided to the patient (including after-hours pager numbers)   Documentation: I have reviewed the above documentation for accuracy and completeness, and I agree with the above.  RUFUS CHRISTELLA HOLY, MD

## 2024-06-25 ENCOUNTER — Encounter: Payer: Self-pay | Admitting: Dermatology

## 2024-06-25 ENCOUNTER — Ambulatory Visit (INDEPENDENT_AMBULATORY_CARE_PROVIDER_SITE_OTHER): Admitting: Dermatology

## 2024-06-25 VITALS — BP 118/66 | HR 73

## 2024-06-25 DIAGNOSIS — T1490XD Injury, unspecified, subsequent encounter: Secondary | ICD-10-CM

## 2024-06-25 DIAGNOSIS — C439 Malignant melanoma of skin, unspecified: Secondary | ICD-10-CM

## 2024-06-25 DIAGNOSIS — L814 Other melanin hyperpigmentation: Secondary | ICD-10-CM

## 2024-06-25 DIAGNOSIS — L905 Scar conditions and fibrosis of skin: Secondary | ICD-10-CM

## 2024-06-25 MED ORDER — DOXYCYCLINE HYCLATE 100 MG PO TABS: 100.0000 mg | ORAL_TABLET | Freq: Two times a day (BID) | ORAL | 0 refills | Status: DC

## 2024-06-25 MED ORDER — MUPIROCIN 2 % EX OINT: 1.0000 | TOPICAL_OINTMENT | Freq: Two times a day (BID) | CUTANEOUS | 3 refills | Status: DC

## 2024-06-25 NOTE — Patient Instructions (Signed)

## 2024-06-25 NOTE — Progress Notes (Signed)
   Follow Up Visit   Subjective  Joy Mosley is a 73 y.o. female who presents for the following: follow up from Mohs surgery   The patient presents for follow up from Mohs surgery for a Malignant Melanoma on the mid chin, treated on 06/18/2024, repaired with Double bilateral advancement flap. The patient has been bandaging the wound as directed. The endorse the following concerns: none. Suture Removal. Patient states that she did not take the tramadol  due to concern for the side effect of dizziness. She has been taking tylenol  for pain.   The following portions of the chart were reviewed this encounter and updated as appropriate: medications, allergies, medical history  Review of Systems:  No other skin or systemic complaints except as noted in HPI or Assessment and Plan.  Objective  Well appearing patient in no apparent distress; mood and affect are within normal limits.  A focal examination was performed including the chin. All findings within normal limits unless otherwise noted below.  Healing wound with mild erythema  Relevant physical exam findings are noted in the Assessment and Plan.  Right Temple Tan macules with moth-eaten borders in sun-exposed skin    Assessment & Plan   Healing Wound s/p Mohs for Melanoma in Situ on the mid chin, treated on 06/18/2024, repaired with a double advancement flap - sutures removed today.  - Reassured that wound is healing well - No evidence of infection - No swelling, induration, purulence, dehiscence, or tenderness out of proportion to the clinical exam, see photo above - Discussed that scars take up to 12 months to mature from the date of surgery - Ok to continue ointment daily to wound under a bandage for another 1 week. - mupirocin  ointment (BACTROBAN ) 2 %; Apply 1 Application topically 2 (two) times daily.  Dispense: 22 g; Refill: 3 - doxycycline  (VIBRA -TABS) 100 MG tablet; Take 1 tablet (100 mg total) by mouth 2 (two) times daily  for 7 days.  Dispense: 14 tablet; Refill: 0  HISTORY OF MELANOMA IN SITU - No evidence of recurrence today - Recommend regular full body skin exams - Recommend daily broad spectrum sunscreen SPF 30+ to sun-exposed areas, reapply every 2 hours as needed.  - Call if any new or changing lesions are noted between office visits  Encounter for Removal of Sutures - Incision site at the mid chin is clean, dry and intact - Wound cleansed, sutures removed, wound cleansed - Scars remodel for a full year. - Patient advised to call with any concerns or if they notice any new or changing lesions.  Lentigo- right eyebrow - Monitor for changes - Photo taken  Return in about 3 weeks (around 07/16/2024) for Follow Up/ 6 month FBSE.  LILLETTE Rollene Gobble, RN, am acting as scribe for RUFUS CHRISTELLA HOLY, MD .   Documentation: I have reviewed the above documentation for accuracy and completeness, and I agree with the above.  RUFUS CHRISTELLA HOLY, MD

## 2024-06-29 ENCOUNTER — Encounter: Payer: Self-pay | Admitting: Family Medicine

## 2024-06-29 ENCOUNTER — Ambulatory Visit (INDEPENDENT_AMBULATORY_CARE_PROVIDER_SITE_OTHER): Payer: Medicare Other | Admitting: Family Medicine

## 2024-06-29 VITALS — BP 120/68 | HR 62 | Temp 98.1°F | Wt 160.8 lb

## 2024-06-29 DIAGNOSIS — E559 Vitamin D deficiency, unspecified: Secondary | ICD-10-CM | POA: Diagnosis not present

## 2024-06-29 DIAGNOSIS — E663 Overweight: Secondary | ICD-10-CM | POA: Diagnosis not present

## 2024-06-29 DIAGNOSIS — E041 Nontoxic single thyroid nodule: Secondary | ICD-10-CM | POA: Diagnosis not present

## 2024-06-29 LAB — CBC WITH DIFFERENTIAL/PLATELET
Basophils Absolute: 0.1 K/uL (ref 0.0–0.1)
Basophils Relative: 1.2 % (ref 0.0–3.0)
Eosinophils Absolute: 0.1 K/uL (ref 0.0–0.7)
Eosinophils Relative: 1.3 % (ref 0.0–5.0)
HCT: 41.2 % (ref 36.0–46.0)
Hemoglobin: 13.9 g/dL (ref 12.0–15.0)
Lymphocytes Relative: 39.4 % (ref 12.0–46.0)
Lymphs Abs: 2.6 K/uL (ref 0.7–4.0)
MCHC: 33.7 g/dL (ref 30.0–36.0)
MCV: 90.8 fl (ref 78.0–100.0)
Monocytes Absolute: 0.6 K/uL (ref 0.1–1.0)
Monocytes Relative: 9.3 % (ref 3.0–12.0)
Neutro Abs: 3.2 K/uL (ref 1.4–7.7)
Neutrophils Relative %: 48.8 % (ref 43.0–77.0)
Platelets: 314 K/uL (ref 150.0–400.0)
RBC: 4.54 Mil/uL (ref 3.87–5.11)
RDW: 13.4 % (ref 11.5–15.5)
WBC: 6.5 K/uL (ref 4.0–10.5)

## 2024-06-29 LAB — LIPID PANEL
Cholesterol: 186 mg/dL (ref 0–200)
HDL: 46.8 mg/dL (ref >39.00)
LDL Cholesterol: 123 mg/dL — ABNORMAL HIGH (ref 0–99)
NonHDL: 139.35
Total CHOL/HDL Ratio: 4
Triglycerides: 84 mg/dL (ref 0.0–149.0)
VLDL: 16.8 mg/dL (ref 0.0–40.0)

## 2024-06-29 LAB — HEPATIC FUNCTION PANEL
ALT: 13 U/L (ref 0–35)
AST: 18 U/L (ref 0–37)
Albumin: 4.1 g/dL (ref 3.5–5.2)
Alkaline Phosphatase: 49 U/L (ref 39–117)
Bilirubin, Direct: 0.1 mg/dL (ref 0.0–0.3)
Total Bilirubin: 0.6 mg/dL (ref 0.2–1.2)
Total Protein: 7.1 g/dL (ref 6.0–8.3)

## 2024-06-29 LAB — BASIC METABOLIC PANEL WITH GFR
BUN: 15 mg/dL (ref 6–23)
CO2: 27 meq/L (ref 19–32)
Calcium: 9.4 mg/dL (ref 8.4–10.5)
Chloride: 105 meq/L (ref 96–112)
Creatinine, Ser: 0.8 mg/dL (ref 0.40–1.20)
GFR: 72.37 mL/min (ref >60.00)
Glucose, Bld: 89 mg/dL (ref 70–99)
Potassium: 4.2 meq/L (ref 3.5–5.1)
Sodium: 139 meq/L (ref 135–145)

## 2024-06-29 LAB — VITAMIN D 25 HYDROXY (VIT D DEFICIENCY, FRACTURES): VITD: 38.37 ng/mL (ref 30.00–100.00)

## 2024-06-29 LAB — TSH: TSH: 1.59 u[IU]/mL (ref 0.35–5.50)

## 2024-06-29 NOTE — Assessment & Plan Note (Signed)
 Pt's weight and BMI are stable.  She is asymptomatic.  Follows a healthy diet and is physically active.  Applauded her efforts.  Check labs to risk stratify.  Will follow.

## 2024-06-29 NOTE — Assessment & Plan Note (Signed)
 Unchanged.  Per last US  report, she is due for repeat imaging to assess known nodules.  Order placed.

## 2024-06-29 NOTE — Assessment & Plan Note (Signed)
 Check Vit D level and replete prn.

## 2024-06-29 NOTE — Progress Notes (Signed)
   Subjective:    Patient ID: Joy Mosley, female    DOB: [birth year], 73 y.o.   MRN: [id number]  HPI Thyroid  nodule- pt has hx of multiple modules (5 noted on last US ).  Nodule 2 and 5 have been biopsied.  Nodule 4 was recommended to have yearly follow up.  Overweight- weight and BMI are stable at 160 lbs and 28 respectively.  No CP, SOB, abd pain, N/V.  Vit D deficiency- pt has hx of this.  Due for repeat labs.   Review of Systems For ROS see HPI     Objective:   Physical Exam Vitals reviewed.  Constitutional:      General: She is not in acute distress.    Appearance: Normal appearance. She is well-developed. She is not ill-appearing.  HENT:     Head: Normocephalic and atraumatic.  Eyes:     Conjunctiva/sclera: Conjunctivae normal.     Pupils: Pupils are equal, round, and reactive to light.  Neck:     Thyroid : Thyromegaly (known nodules) present.  Cardiovascular:     Rate and Rhythm: Normal rate and regular rhythm.     Pulses: Normal pulses.     Heart sounds: Normal heart sounds. No murmur heard. Pulmonary:     Effort: Pulmonary effort is normal. No respiratory distress.     Breath sounds: Normal breath sounds.  Abdominal:     General: There is no distension.     Palpations: Abdomen is soft.     Tenderness: There is no abdominal tenderness.  Musculoskeletal:     Cervical back: Normal range of motion and neck supple.     Right lower leg: No edema.     Left lower leg: No edema.  Lymphadenopathy:     Cervical: No cervical adenopathy.  Skin:    General: Skin is warm and dry.  Neurological:     General: No focal deficit present.     Mental Status: She is alert and oriented to person, place, and time.  Psychiatric:        Mood and Affect: Mood normal.        Behavior: Behavior normal.        Thought Content: Thought content normal.           Assessment & Plan:

## 2024-06-29 NOTE — Patient Instructions (Signed)
 Follow up in 1 year or as needed We'll notify you of your lab results and make any changes if needed We'll call you to schedule your ultrasound appt Keep up the great work on healthy diet and regular exercise- you look great! Call with any questions or concerns Hang in there!!!

## 2024-06-30 ENCOUNTER — Ambulatory Visit: Payer: Self-pay | Admitting: Family Medicine

## 2024-06-30 NOTE — Progress Notes (Signed)
 Pt has been notified.

## 2024-07-21 ENCOUNTER — Ambulatory Visit (INDEPENDENT_AMBULATORY_CARE_PROVIDER_SITE_OTHER): Admitting: Dermatology

## 2024-07-21 ENCOUNTER — Encounter: Payer: Self-pay | Admitting: Dermatology

## 2024-07-21 VITALS — BP 120/68 | HR 67

## 2024-07-21 DIAGNOSIS — L905 Scar conditions and fibrosis of skin: Secondary | ICD-10-CM

## 2024-07-21 DIAGNOSIS — T1490XD Injury, unspecified, subsequent encounter: Secondary | ICD-10-CM

## 2024-07-21 DIAGNOSIS — C439 Malignant melanoma of skin, unspecified: Secondary | ICD-10-CM

## 2024-07-21 NOTE — Patient Instructions (Signed)

## 2024-07-21 NOTE — Progress Notes (Signed)
   Follow Up Visit   Subjective  Joy Mosley is a 73 y.o. female who presents for the following: follow up from Mohs surgery   The patient presents for follow up from Mohs surgery for a MIS on the mid chest, treated on 06/18/24, repaired with advancement flap. The patient has been bandaging the wound as directed. The endorse the following concerns: the appearance and pain still present but improving.   The following portions of the chart were reviewed this encounter and updated as appropriate: medications, allergies, medical history  Review of Systems:  No other skin or systemic complaints except as noted in HPI or Assessment and Plan.  Objective  Well appearing patient in no apparent distress; mood and affect are within normal limits.  A focal examination was performed including scalp, head, and face. All findings within normal limits unless otherwise noted below.  Healing wound with mild erythema  Relevant physical exam findings are noted in the Assessment and Plan.    Assessment & Plan   Healing Wound s/p Mohs for MIS, treated on 06/18/24, repaired with advancement flap - Reassured that wound is healing well - 2 spitting sutures removed - No evidence of infection - No swelling, induration, purulence, dehiscence, or tenderness out of proportion to the clinical exam, see photo above - Discussed that scars take up to 12 months to mature from the date of surgery - Recommend SPF 30+ to scar daily to prevent purple color from UV exposure during scar maturation process - Discussed that erythema and raised appearance of scar will fade over the next 4-6 months - OK to start scar massage at 4-6 weeks post-op - Can consider silicone based products for scar healing starting at 6 weeks post-op  HISTORY OF MELANOMA IN SITU - No evidence of recurrence today - Recommend regular full body skin exams - Recommend daily broad spectrum sunscreen SPF 30+ to sun-exposed areas, reapply every 2  hours as needed.  - Call if any new or changing lesions are noted between office visits    Return in about 3 months (around 10/20/2024) for TBSC with Erminio and please change her March TBSC to be with Erminio .  I, Berwyn Lesches, Surg Tech III, am acting as scribe for RUFUS CHRISTELLA HOLY, MD.   Documentation: I have reviewed the above documentation for accuracy and completeness, and I agree with the above.  RUFUS CHRISTELLA HOLY, MD

## 2024-07-27 ENCOUNTER — Ambulatory Visit (HOSPITAL_BASED_OUTPATIENT_CLINIC_OR_DEPARTMENT_OTHER)
Admission: RE | Admit: 2024-07-27 | Discharge: 2024-07-27 | Disposition: A | Discharge status: Home / Self Care | Source: Ambulatory Visit | Attending: Family Medicine | Admitting: Family Medicine

## 2024-07-27 DIAGNOSIS — E041 Nontoxic single thyroid nodule: Secondary | ICD-10-CM | POA: Insufficient documentation

## 2024-08-06 NOTE — Progress Notes (Signed)
 Pt has been notified.

## 2024-08-20 DIAGNOSIS — H2513 Age-related nuclear cataract, bilateral: Secondary | ICD-10-CM | POA: Diagnosis not present

## 2024-08-20 DIAGNOSIS — H524 Presbyopia: Secondary | ICD-10-CM | POA: Diagnosis not present

## 2024-08-20 DIAGNOSIS — H04123 Dry eye syndrome of bilateral lacrimal glands: Secondary | ICD-10-CM | POA: Diagnosis not present

## 2024-08-20 DIAGNOSIS — H40013 Open angle with borderline findings, low risk, bilateral: Secondary | ICD-10-CM | POA: Diagnosis not present

## 2024-09-03 ENCOUNTER — Ambulatory Visit: Payer: Self-pay

## 2024-09-03 DIAGNOSIS — E041 Nontoxic single thyroid nodule: Secondary | ICD-10-CM

## 2024-09-03 DIAGNOSIS — R490 Dysphonia: Secondary | ICD-10-CM

## 2024-09-03 NOTE — Telephone Encounter (Signed)
 Called [facility] and advised them of triage sent over for ER Refusal.

## 2024-09-03 NOTE — Telephone Encounter (Signed)
 Placed referral and called patient to inform her that the referral was placed and they should give her a call within 1-2 weeks from now. Did let patient know to give our office a call if no one reaches out to her.

## 2024-09-03 NOTE — Telephone Encounter (Signed)
 If there is concern about vocal cord compression from thyroid  nodules, it would not be an Endocrinology referral.  They medically manage the thyroid .  If she is worried about hoarseness and compression, that would be an ENT referral.  Ok to place ENT referral- dx hoarseness, thyroid  nodules and make pt aware that they will call to schedule

## 2024-09-03 NOTE — Telephone Encounter (Signed)
 Patient has thyroid  nodules that she has checked out as well Patient had been advised by other people that it was possible for thyroid  nodules pressing on vocal cords Patient wants an appt with an endocrinologist Sometimes certain things give her a little trouble in swallowing Worse in the past few months especially Patient was advised that the ER was recommended but patient refused and she wants to quickly have an appointment with a specialist in endocrinology because she states that she believes this is her thyroid  nodules   FYI Only or Action Required?: Action required by provider: request for appointment.  Patient was last seen in primary care on 06/29/2024 by Mahlon Comer BRAVO, MD.  Called Nurse Triage reporting Hoarse.  Symptoms began a year ago.  Interventions attempted: Rest, hydration, or home remedies.  Symptoms are: gradually worsening.  Triage Disposition: Go to ED Now (or PCP Triage)  Patient/caregiver understands and will follow disposition?: No, wishes to speak with PCP                    Copied from CRM #[id number]. Topic: Clinical - Red Word Triage >> Sep 03, 2024 11:58 AM Suzen RAMAN wrote: Red Word that prompted transfer to Nurse Triage: worsening persistent hoariness and throat nodules. Reason for Disposition  Patient sounds very sick or weak to the triager  Answer Assessment - Initial Assessment Questions Patient has thyroid  nodules that she has checked out as well Patient had been advised by other people that it was possible for thyroid  nodules pressing on vocal cords due to their experiences Patient wants an appt with an endocrinologist Sometimes certain things give her a little trouble in swallowing Worse in the past few months especially Patient was advised that the ER was recommended but patient refused and she wants to quickly have an appointment with a specialist in endocrinology because she states that she believes this is her thyroid   nodules She wants to get special testing or certain panels to check her thyroids Patient is advised to call us  back if anything changes or with any further questions/concerns. Patient is advised that if anything worsens to call 911 Patient verbalized understanding.     1. DESCRIPTION: Describe your voice. (e.g., coarse, raspy, weaker, airy, scratchy, deeper)     Voice very hoarse 3. ONSET: When did the hoarseness begin?     A year ago 4. COUGH: Is there a cough? If Yes, ask: How bad is it?     no 5. FEVER: Do you have a fever? If Yes, ask: What is your temperature, how was it measured, and when did it start?     no 6. ALLERGIES: Do you have any allergy symptoms? If Yes, ask: What are they? (e.g., nose stuffiness)     unknown 7. IRRITANTS: Do you smoke? Have you been exposed to any irritating fumes? (e.g., smoke)     no 8. CAUSE: What do you think is causing the hoarseness?     ------ 9. OTHER SYMPTOMS: Do you have any other symptoms? (e.g., breathing difficulty, fever, foreign body, lymph node swelling in neck, rash, sore throat, swallowing difficulty, weight loss)     Some trouble swallowing, hoarseness  Protocols used: Hoarseness-A-AH

## 2024-09-15 ENCOUNTER — Encounter: Payer: Self-pay | Admitting: Family Medicine

## 2024-10-09 ENCOUNTER — Ambulatory Visit (INDEPENDENT_AMBULATORY_CARE_PROVIDER_SITE_OTHER)

## 2024-10-09 VITALS — BP 125/81 | HR 71

## 2024-10-09 DIAGNOSIS — E049 Nontoxic goiter, unspecified: Secondary | ICD-10-CM | POA: Diagnosis not present

## 2024-10-09 DIAGNOSIS — R490 Dysphonia: Secondary | ICD-10-CM

## 2024-10-09 DIAGNOSIS — J383 Other diseases of vocal cords: Secondary | ICD-10-CM | POA: Diagnosis not present

## 2024-10-09 NOTE — Patient Instructions (Signed)
 Spasmodic dysphonia Laryngeal Dystonia

## 2024-10-09 NOTE — Progress Notes (Signed)
 Dear Dr. Mahlon, Here is my assessment for our mutual patient, Joy Mosley. Thank you for allowing me the opportunity to care for your patient. Please do not hesitate to contact me should you have any other questions. Sincerely, Dr. Hadassah Parody  Otolaryngology Clinic Note Referring provider: Dr. Mahlon HPI:   Initial HPI (10/09/2024) Joy Mosley is a 73 year old female with a thyroid  nodule who presents for evaluation of progressive dysphonia.  Reports persistent progressively worsening dysphonia over the last 5 years.  Voice is consistently hoarse and worsens with prolonged speaking.  She has to take frequent pauses to catch her breath while talking.  Dysphonia is noticeable to others and thinks it is more bothersome to others than it is to herself.  She does not have intermittent return to normal voice except for a brief period last December while in [place].  Since then, it has remained constant.  Sometimes singing makes it slightly better but nothing else makes it better.  Nothing seems to make it worse.  She wonders if stress could make it worse, does meditation often and tries to relieve any stress in her life but this does not seem to make it better or worse.  It is no worse when talking on the phone or when around people - it is the same if she is home by herself.  She saw an ENT last year who scoped her said this was likely due to the LPR and recommended she follow-up with voice therapy.  Her main concern today she wants to ensure there is nothing dangerous regarding her condition and not related to her thyroid  goiter.  She denies any history of tremor.   Independent Review of Additional Tests or Records:  Thyroid  ultrasound 07/27/2024 showing multinodular thyroid , both lobes measuring at least 6 cm  ENT note 09/17/2022 Meghan Skotnicki, DO reviewed: Noted normal scope exam and recommended management for LPR and voice therapy.   PMH/Meds/All/SocHx/FamHx/ROS:   Past  Medical History:  Diagnosis Date   Diverticulitis    Melanoma (HCC)      Past Surgical History:  Procedure Laterality Date   BREAST CYST ASPIRATION     CHOLECYSTECTOMY N/A 11/25/2018   Procedure: LAPAROSCOPIC CHOLECYSTECTOMY WITH  INTRAOPERATIVE CHOLANGIOGRAM;  Surgeon: Kinsinger, Herlene Righter, MD;  Location: [facility] OR;  Service: General;  Laterality: N/A;   OVARIAN CYST REMOVAL  1980   TONSILLECTOMY AND ADENOIDECTOMY      Family History  Problem Relation Age of Onset   Leukemia Mother    Cancer Father        prostate   Cancer Maternal Grandfather    Breast cancer Sister      Social Connections: Moderately Isolated (06/28/2024)   Social Connection and Isolation Panel    Frequency of Communication with Friends and Family: More than three times a week    Frequency of Social Gatherings with Friends and Family: Twice a week    Attends Religious Services: Patient declined    Database Administrator or Organizations: Yes    Attends Banker Meetings: More than 4 times per year    Marital Status: Widowed     Current Outpatient Medications  Medication Instructions   Vitamin D  (Ergocalciferol ) (DRISDOL ) 50,000 Units, Oral, Every 7 days   Vitamin D  (Ergocalciferol ) (DRISDOL ) 50,000 Units, Every 7 days     Physical Exam:   BP 125/81 (BP Location: Left Arm, Patient Position: Sitting, Cuff Size: Small)   Pulse 71   SpO2 95%  Salient findings:  CN II-XII intact Bilateral EAC clear and TM intact with well pneumatized middle ear spaces Anterior rhinoscopy: Septum midline; bilateral inferior turbinates without significant No lesions of oral cavity/oropharynx No respiratory distress or stridor  Dysphonic, tremulous staccato voice, worsened when counting from 80-89 TFL was indicated to better evaluate the proximal airway, given the patient's history and exam findings, and is detailed below.   Seprately Identifiable Procedures:  Prior to initiating any procedures,  risks/benefits/alternatives were explained to the patient and verbal consent obtained.  Procedure Note (10/09/2024) Pre-procedure diagnosis:  Dysphonia  Post-procedure diagnosis: Same, laryngeal dystonia  Procedure: Transnasal Fiberoptic Laryngoscopy, CPT 31575 - Mod 25 Indication: Dysphonia Complications: None apparent EBL: 0 mL  The procedure was undertaken to further evaluate the patient's complaint of dysphonia, with mirror exam inadequate for appropriate examination due to gag reflex and poor patient tolerance  Procedure:  Patient was identified as correct patient. Verbal consent was obtained. The nose was sprayed with oxymetazoline and 4% lidocaine . The The flexible laryngoscope was passed through the nose to view the nasal cavity, pharynx (oropharynx, hypopharynx) and larynx.  The larynx was examined at rest and during multiple phonatory tasks. Documentation was obtained and reviewed with patient. The scope was removed. The patient tolerated the procedure well.  Findings: The nasal cavity and nasopharynx did not reveal any masses or lesions, mucosa appeared to be without obvious lesions. The tongue base, pharyngeal walls, piriform sinuses, vallecula, epiglottis and postcricoid region are normal in appearance EXCEPT: Involuntary, sudden adduction of vocal folds. The visualized portion of the subglottis and proximal trachea is widely patent. The vocal folds are mobile bilaterally. There are no lesions on the free edge of the vocal folds nor elsewhere in the larynx worrisome for malignancy.    Electronically signed by: Hadassah JAYSON Parody, MD 10/09/2024 5:55 PM   Impression & Plans:  Joy Mosley is a 73 y.o. female with   1. Laryngeal dystonia   2. Adductor spasmodic dysphonia   3. 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 Voice  characteristics are suggestive of spasmodic dysphonia, which remains the working diagnosis.  Her scope exam showed normal vocal fold mobility (i.e. no vocal fold paralysis) but she does have spastic, jerky closure of her vocal folds while speaking, has difficulty counting from 80-89, supporting diagnosis of adductor type dysphonia.  I discussed with her the differential included muscle tension dysphonia but she does not appear to have this type of pattern on her scope exam. Discussed with patient that the treatment of choice would be Botox injection.  I would recommend she go to [facility] for this.  At this time, she is not particularly bothered but she will think about this and let me know if she wants referral to [facility]. - Discussed spasmodic dysphonia as the working diagnosis.  Thyroid  goiter -She had concerned that thyroid  may be causing compression and affecting her recurrent laryngeal nerves.  I reassured her that her vocal folds are moving and not paralyzed or paretic.    See below regarding exact medications prescribed this encounter including dosages and route: No orders of the defined types were placed in this encounter.   Thank you for allowing me the opportunity to care for your patient. Please do not hesitate to contact me should you have any other questions.  Sincerely, Hadassah Parody, MD Otolaryngologist (ENT), [facility] Phone: [phone] Fax: [phone]  MDM:  Level 4 Complexity/Problems addressed: 4-chronic worsening problem Data complexity: 4-independent review of ultrasound, 1 note - Morbidity: Low - Prescription Drug prescribed or managed: No

## 2024-10-20 ENCOUNTER — Ambulatory Visit (INDEPENDENT_AMBULATORY_CARE_PROVIDER_SITE_OTHER): Admitting: Physician Assistant

## 2024-10-20 ENCOUNTER — Encounter: Payer: Self-pay | Admitting: Physician Assistant

## 2024-10-20 VITALS — BP 123/83

## 2024-10-20 DIAGNOSIS — D1801 Hemangioma of skin and subcutaneous tissue: Secondary | ICD-10-CM | POA: Diagnosis not present

## 2024-10-20 DIAGNOSIS — D229 Melanocytic nevi, unspecified: Secondary | ICD-10-CM

## 2024-10-20 DIAGNOSIS — W908XXA Exposure to other nonionizing radiation, initial encounter: Secondary | ICD-10-CM | POA: Diagnosis not present

## 2024-10-20 DIAGNOSIS — L821 Other seborrheic keratosis: Secondary | ICD-10-CM | POA: Diagnosis not present

## 2024-10-20 DIAGNOSIS — Z86006 Personal history of melanoma in-situ: Secondary | ICD-10-CM

## 2024-10-20 DIAGNOSIS — L578 Other skin changes due to chronic exposure to nonionizing radiation: Secondary | ICD-10-CM | POA: Diagnosis not present

## 2024-10-20 DIAGNOSIS — Z1283 Encounter for screening for malignant neoplasm of skin: Secondary | ICD-10-CM | POA: Diagnosis not present

## 2024-10-20 DIAGNOSIS — D039 Melanoma in situ, unspecified: Secondary | ICD-10-CM | POA: Insufficient documentation

## 2024-10-20 DIAGNOSIS — L814 Other melanin hyperpigmentation: Secondary | ICD-10-CM

## 2024-10-20 NOTE — Patient Instructions (Signed)

## 2024-10-20 NOTE — Progress Notes (Signed)
" ° °  Total Body Skin Exam (TBSE) Visit   Subjective  Joy Mosley is a 73 y.o. female ESTABLISHED PATIENT who presents for the following:  Total Body Skin Exam (TBSE)  Patient was last evaluated for TBSE on 05/19/24 .  Patient does not have spots of concern to be evaluated. She does apply sunscreen and/or wears protective coverings. Hx of Bx - MIS - mid chin - MOHS 06/18/24  The patient has spots, moles and lesions to be evaluated, some may be new or changing and the patient has concerns that these could be cancer.  The following portions of the chart were reviewed this encounter and updated as appropriate: medications, allergies, medical history  Review of Systems:  No other skin or systemic complaints except as noted in HPI or Assessment and Plan.  Objective  Well appearing patient in no apparent distress; mood and affect are within normal limits.  A full examination was performed including scalp, head, eyes, ears, nose, lips, neck, chest, axillae, abdomen, back, buttocks, bilateral upper extremities, bilateral lower extremities, hands, feet, fingers, toes, fingernails, and toenails. All findings within normal limits unless otherwise noted below.   Relevant physical exam findings are noted in the Assessment and Plan.        Assessment & Plan   HISTORY OF MELANOMA IN SITU - mid chin - MOHS 06/18/24 - No evidence of recurrence today - Recommend regular full body skin exams - Recommend daily broad spectrum sunscreen SPF 30+ to sun-exposed areas, reapply every 2 hours as needed.  - Call if any new or changing lesions are noted between office visits - Continue to use scar cream as directed    LENTIGINES, SEBORRHEIC KERATOSES, HEMANGIOMAS - Benign normal skin lesions - Benign-appearing - Call for any changes  BENIGN MELANOCYTIC NEVI - Tan-brown and/or pink-flesh-colored symmetric macules and papules - Benign appearing on exam today - Observation - Call clinic for new or  changing moles - Recommend daily use of broad spectrum spf 30+ sunscreen to sun-exposed areas.   MILD ACTINIC DAMAGE - Chronic condition, secondary to cumulative UV/sun exposure - diffuse scaly erythematous macules with underlying dyspigmentation - Recommend daily broad spectrum sunscreen SPF 30+ to sun-exposed areas, reapply every 2 hours as needed.  - Staying in the shade or wearing long sleeves, sun glasses (UVA+UVB protection) and wide brim hats (4-inch brim around the entire circumference of the hat) are also recommended for sun protection.  - Call for new or changing lesions.   SKIN CANCER SCREENING PERFORMED TODAY.   HISTORY OF MELANOMA IN SITU   ACTINIC SKIN DAMAGE   CHERRY ANGIOMA   LENTIGINES   MULTIPLE BENIGN NEVI   SEBORRHEIC KERATOSIS   SCREENING EXAM FOR SKIN CANCER   Return in about 3 months (around 01/18/2025) for TBSE.   Documentation: I have reviewed the above documentation for accuracy and completeness, and I agree with the above.  I, Shirron Maranda, CMA II, am acting as scribe for:  Tiaira Arambula K, PA-C "

## 2024-12-23 ENCOUNTER — Ambulatory Visit: Admitting: Physician Assistant

## 2024-12-23 ENCOUNTER — Ambulatory Visit: Admitting: Dermatology

## 2025-01-05 ENCOUNTER — Encounter
